# Patient Record
Sex: Female | Born: 1955
Health system: Southern US, Community
[De-identification: ages and names within clinical notes are randomized; demographics above are authoritative.]

## PROBLEM LIST (undated history)

## (undated) HISTORY — PX: CHOLECYSTECTOMY: SHX55

## (undated) HISTORY — PX: BREAST CYST ASPIRATION: SHX578

---

## 2016-03-14 LAB — HM COLONOSCOPY

## 2018-12-13 DIAGNOSIS — H43811 Vitreous degeneration, right eye: Secondary | ICD-10-CM | POA: Diagnosis not present

## 2019-12-10 DIAGNOSIS — R32 Unspecified urinary incontinence: Secondary | ICD-10-CM | POA: Insufficient documentation

## 2019-12-10 DIAGNOSIS — Z23 Encounter for immunization: Secondary | ICD-10-CM | POA: Diagnosis not present

## 2019-12-10 DIAGNOSIS — H4922 Sixth [abducent] nerve palsy, left eye: Secondary | ICD-10-CM | POA: Diagnosis not present

## 2019-12-10 DIAGNOSIS — Z0000 Encounter for general adult medical examination without abnormal findings: Secondary | ICD-10-CM | POA: Diagnosis not present

## 2019-12-10 DIAGNOSIS — Z130 Encounter for screening for diseases of the blood and blood-forming organs and certain disorders involving the immune mechanism: Secondary | ICD-10-CM | POA: Diagnosis not present

## 2019-12-10 DIAGNOSIS — Z124 Encounter for screening for malignant neoplasm of cervix: Secondary | ICD-10-CM | POA: Diagnosis not present

## 2019-12-10 DIAGNOSIS — E559 Vitamin D deficiency, unspecified: Secondary | ICD-10-CM | POA: Diagnosis not present

## 2019-12-10 DIAGNOSIS — Z131 Encounter for screening for diabetes mellitus: Secondary | ICD-10-CM | POA: Diagnosis not present

## 2019-12-10 DIAGNOSIS — R1030 Lower abdominal pain, unspecified: Secondary | ICD-10-CM | POA: Diagnosis not present

## 2019-12-10 DIAGNOSIS — R5383 Other fatigue: Secondary | ICD-10-CM | POA: Diagnosis not present

## 2019-12-10 DIAGNOSIS — Z9049 Acquired absence of other specified parts of digestive tract: Secondary | ICD-10-CM | POA: Diagnosis not present

## 2019-12-10 DIAGNOSIS — Z1231 Encounter for screening mammogram for malignant neoplasm of breast: Secondary | ICD-10-CM | POA: Diagnosis not present

## 2019-12-10 DIAGNOSIS — Z13220 Encounter for screening for lipoid disorders: Secondary | ICD-10-CM | POA: Diagnosis not present

## 2019-12-10 LAB — HM PAP SMEAR: HM Pap smear: NEGATIVE

## 2019-12-10 LAB — RESULTS CONSOLE HPV: CHL HPV: NEGATIVE

## 2019-12-10 LAB — HM MAMMOGRAPHY

## 2019-12-24 ENCOUNTER — Ambulatory Visit: Payer: Self-pay | Admitting: Family Medicine

## 2020-01-08 ENCOUNTER — Ambulatory Visit: Payer: Self-pay | Admitting: Family Medicine

## 2020-01-30 DIAGNOSIS — R278 Other lack of coordination: Secondary | ICD-10-CM | POA: Diagnosis not present

## 2020-01-30 DIAGNOSIS — N816 Rectocele: Secondary | ICD-10-CM | POA: Diagnosis not present

## 2020-01-30 DIAGNOSIS — N6001 Solitary cyst of right breast: Secondary | ICD-10-CM | POA: Diagnosis not present

## 2020-01-30 DIAGNOSIS — N6011 Diffuse cystic mastopathy of right breast: Secondary | ICD-10-CM | POA: Diagnosis not present

## 2020-01-30 DIAGNOSIS — N854 Malposition of uterus: Secondary | ICD-10-CM | POA: Diagnosis not present

## 2020-01-30 DIAGNOSIS — R350 Frequency of micturition: Secondary | ICD-10-CM | POA: Diagnosis not present

## 2020-01-30 DIAGNOSIS — R102 Pelvic and perineal pain: Secondary | ICD-10-CM | POA: Diagnosis not present

## 2020-01-30 DIAGNOSIS — R32 Unspecified urinary incontinence: Secondary | ICD-10-CM | POA: Diagnosis not present

## 2020-02-08 DIAGNOSIS — N858 Other specified noninflammatory disorders of uterus: Secondary | ICD-10-CM | POA: Diagnosis not present

## 2020-02-08 DIAGNOSIS — N3941 Urge incontinence: Secondary | ICD-10-CM | POA: Diagnosis not present

## 2020-02-08 DIAGNOSIS — R9389 Abnormal findings on diagnostic imaging of other specified body structures: Secondary | ICD-10-CM | POA: Diagnosis not present

## 2020-04-30 DIAGNOSIS — R3129 Other microscopic hematuria: Secondary | ICD-10-CM | POA: Diagnosis not present

## 2020-04-30 DIAGNOSIS — R3915 Urgency of urination: Secondary | ICD-10-CM | POA: Diagnosis not present

## 2020-04-30 DIAGNOSIS — R9389 Abnormal findings on diagnostic imaging of other specified body structures: Secondary | ICD-10-CM | POA: Diagnosis not present

## 2020-04-30 DIAGNOSIS — N816 Rectocele: Secondary | ICD-10-CM | POA: Diagnosis not present

## 2020-05-23 ENCOUNTER — Other Ambulatory Visit: Payer: Self-pay | Admitting: Optometry

## 2020-05-23 DIAGNOSIS — H05241 Constant exophthalmos, right eye: Secondary | ICD-10-CM | POA: Diagnosis not present

## 2020-05-23 DIAGNOSIS — H16211 Exposure keratoconjunctivitis, right eye: Secondary | ICD-10-CM | POA: Diagnosis not present

## 2020-05-23 DIAGNOSIS — H0520 Unspecified exophthalmos: Secondary | ICD-10-CM

## 2020-06-04 ENCOUNTER — Ambulatory Visit: Payer: BC Managed Care – PPO

## 2020-06-23 ENCOUNTER — Other Ambulatory Visit: Payer: Self-pay

## 2020-06-23 ENCOUNTER — Ambulatory Visit
Admission: RE | Admit: 2020-06-23 | Discharge: 2020-06-23 | Disposition: A | Discharge status: Home / Self Care | Payer: BC Managed Care – PPO | Source: Ambulatory Visit | Attending: Optometry | Admitting: Optometry

## 2020-06-23 DIAGNOSIS — H5711 Ocular pain, right eye: Secondary | ICD-10-CM | POA: Diagnosis not present

## 2020-06-23 DIAGNOSIS — H0520 Unspecified exophthalmos: Secondary | ICD-10-CM | POA: Diagnosis not present

## 2020-08-21 ENCOUNTER — Telehealth: Payer: Self-pay

## 2020-08-21 NOTE — Telephone Encounter (Signed)
Allison Lowe called in wanted to know if Dr. Einar Pheasant would take her on as a patient her husband [name] 05/03/54.

## 2020-08-21 NOTE — Telephone Encounter (Signed)
That would be fine. Can schedule for September/October

## 2020-09-29 ENCOUNTER — Other Ambulatory Visit: Payer: Self-pay

## 2020-09-29 ENCOUNTER — Ambulatory Visit (INDEPENDENT_AMBULATORY_CARE_PROVIDER_SITE_OTHER): Payer: BC Managed Care – PPO | Admitting: Family Medicine

## 2020-09-29 ENCOUNTER — Encounter: Payer: Self-pay | Admitting: Family Medicine

## 2020-09-29 VITALS — BP 130/82 | HR 75 | Temp 97.1°F | Ht 60.0 in | Wt 153.2 lb

## 2020-09-29 DIAGNOSIS — Z23 Encounter for immunization: Secondary | ICD-10-CM | POA: Diagnosis not present

## 2020-09-29 DIAGNOSIS — N6001 Solitary cyst of right breast: Secondary | ICD-10-CM

## 2020-09-29 DIAGNOSIS — E663 Overweight: Secondary | ICD-10-CM

## 2020-09-29 DIAGNOSIS — D219 Benign neoplasm of connective and other soft tissue, unspecified: Secondary | ICD-10-CM | POA: Insufficient documentation

## 2020-09-29 NOTE — Progress Notes (Signed)
Subjective:     Allison Lowe is a 65 y.o. female presenting for Establish Care     HPI  #weight loss - would like to lose 30 lbs  - not currently doing anything to lose weight - is going on a 3 week road trip to [place] - is trying to reduce red meat and cheese  #Breast cyst - due for f/u imaging for complex cyst   Review of Systems   Social History   Tobacco Use  Smoking Status Former   Packs/day: 0.10   Years: 15.00   Pack years: 1.50   Types: Cigarettes   Quit date: 01/19/2000   Years since quitting: 20.7   Passive exposure: Never  Smokeless Tobacco Never        Objective:    BP Readings from Last 3 Encounters:  09/29/20 130/82   Wt Readings from Last 3 Encounters:  09/29/20 153 lb 4 oz (69.5 kg)    BP 130/82   Pulse 75   Temp (!) 97.1 F (36.2 C) (Temporal)   Ht 5' (1.524 m)   Wt 153 lb 4 oz (69.5 kg)   SpO2 96%   BMI 29.93 kg/m    Physical Exam Constitutional:      General: She is not in acute distress.    Appearance: She is well-developed. She is not diaphoretic.  HENT:     Right Ear: External ear normal.     Left Ear: External ear normal.  Eyes:     Conjunctiva/sclera: Conjunctivae normal.  Cardiovascular:     Rate and Rhythm: Normal rate and regular rhythm.     Heart sounds: No murmur heard. Pulmonary:     Effort: Pulmonary effort is normal. No respiratory distress.     Breath sounds: Normal breath sounds. No wheezing.  Musculoskeletal:     Cervical back: Neck supple.  Skin:    General: Skin is warm and dry.     Capillary Refill: Capillary refill takes less than 2 seconds.  Neurological:     Mental Status: She is alert. Mental status is at baseline.  Psychiatric:        Mood and Affect: Mood normal.        Behavior: Behavior normal.          Assessment & Plan:   Problem List Items Addressed This Visit       Other   Cyst of right breast    Reviewed prior ultrasound 01/2020 with complex and simple cyst recommending  follow-up. Imaging ordered and number for [facility] provided      Relevant Orders   US BREAST LTD UNI RIGHT INC AXILLA   MM 3D SCREEN BREAST BILATERAL   Overweight (BMI 25.0-29.9)    Discussed she may not be eating enough. Advised that she see nutritionist to discuss diet and changes. Referral placed. Encouraged regular exercise      Relevant Orders   Ambulatory referral to Nutrition and Diabetic Education   Other Visit Diagnoses     Need for influenza vaccination    -  Primary   Relevant Orders   Flu Vaccine QUAD 89moIM (Fluarix, Fluzone & Alfiuria Quad PF) (Completed)        Return in about 6 months (around 03/29/2021) for annual.  JLesleigh Noe MD  This visit occurred during the SARS-CoV-2 public health emergency.  Safety protocols were in place, including screening questions prior to the visit, additional usage of staff PPE, and extensive cleaning of exam room while observing  appropriate contact time as indicated for disinfecting solutions.

## 2020-09-29 NOTE — Assessment & Plan Note (Signed)
Discussed she may not be eating enough. Advised that she see nutritionist to discuss diet and changes. Referral placed. Encouraged regular exercise

## 2020-09-29 NOTE — Patient Instructions (Addendum)
Check with insurance that you can do your annual early   Would recommend the following for bone health:   1) 800 units of Vitamin D daily (or could do 5000 twice weekly) 2) Get 1200 mg of elemental calcium --- this is best from your diet. Try to track how much calcium you get on a typical day. You could find ways to add more (dairy products, leafy greens). Take a supplement for whatever you don't typically get so you reach 1200 mg of calcium.  3) Physical activity (ideally weight bearing) - like walking briskly 30 minutes 5 days a week.    Here is what I would recommend:  1) Increase the amount of water you drink a day > specifically drink a glass of water 8 oz or more before every meal 2) Could start a fiber supplement (like metamucil) > You could take this up to 3 times a day, but I would start with 1 time a day until you get used to it. It may cause some stomach upset 3) Make sure you sit down to eat and eat slowly (cut meat one piece at a time) > specifically train your body to eat only at the table (avoiding snacking in front of the TV) 4) Fill up on healthy items first > consider eating a salad with low calorie salad dressing before every meal  5) Make 1/2 of your plate vegetables 6) Keep healthy snacks available for those times when you are bored 7) Consider using a calorie counting app like MyFitnessPal > counting calories helps you make wise choices around snacking. Or if you can afford it you could sign up for Weight Watchers -- and learn about healthy options through a point system   You can call for a mammogram at these locations:  [facility] at [facility].  [place]

## 2020-09-29 NOTE — Assessment & Plan Note (Signed)
Reviewed prior ultrasound 01/2020 with complex and simple cyst recommending follow-up. Imaging ordered and number for [facility] provided

## 2020-10-08 ENCOUNTER — Other Ambulatory Visit: Payer: Self-pay | Admitting: *Deleted

## 2020-10-08 ENCOUNTER — Inpatient Hospital Stay
Admission: RE | Admit: 2020-10-08 | Discharge: 2020-10-08 | Disposition: A | Discharge status: Home / Self Care | Payer: Self-pay | Source: Ambulatory Visit | Attending: *Deleted | Admitting: *Deleted

## 2020-10-08 ENCOUNTER — Other Ambulatory Visit: Payer: Self-pay | Admitting: Family Medicine

## 2020-10-08 DIAGNOSIS — Z1231 Encounter for screening mammogram for malignant neoplasm of breast: Secondary | ICD-10-CM

## 2020-10-08 DIAGNOSIS — N6310 Unspecified lump in the right breast, unspecified quadrant: Secondary | ICD-10-CM

## 2020-11-04 ENCOUNTER — Ambulatory Visit (INDEPENDENT_AMBULATORY_CARE_PROVIDER_SITE_OTHER): Payer: BC Managed Care – PPO | Admitting: Family Medicine

## 2020-11-04 ENCOUNTER — Other Ambulatory Visit: Payer: Self-pay

## 2020-11-04 ENCOUNTER — Encounter: Payer: Self-pay | Admitting: Family Medicine

## 2020-11-04 VITALS — BP 142/82 | HR 66 | Temp 97.0°F | Ht 60.0 in | Wt 153.2 lb

## 2020-11-04 DIAGNOSIS — Z114 Encounter for screening for human immunodeficiency virus [HIV]: Secondary | ICD-10-CM

## 2020-11-04 DIAGNOSIS — R030 Elevated blood-pressure reading, without diagnosis of hypertension: Secondary | ICD-10-CM | POA: Insufficient documentation

## 2020-11-04 DIAGNOSIS — E663 Overweight: Secondary | ICD-10-CM | POA: Diagnosis not present

## 2020-11-04 DIAGNOSIS — Z0000 Encounter for general adult medical examination without abnormal findings: Secondary | ICD-10-CM | POA: Diagnosis not present

## 2020-11-04 DIAGNOSIS — Z23 Encounter for immunization: Secondary | ICD-10-CM

## 2020-11-04 NOTE — Progress Notes (Signed)
Annual Exam   Chief Complaint:  Chief Complaint  Patient presents with   Annual Exam    No concerns     History of Present Illness:  Ms. Allison Lowe is a 65 y.o. No obstetric history on file. who LMP was No LMP recorded. Patient is postmenopausal., presents today for her annual examination.     Nutrition She does get adequate calcium and Vitamin D in her diet. Diet: salad for lunch, healthy Exercise: recent travel so not much recently  Safety The patient wears seatbelts: yes.     The patient feels safe at home and in their relationships: yes.   Menstrual:  No issues  GYN She is single partner, contraception - post menopausal status.    Cervical Cancer Screening (21-65):   Last Pap:   November 2021 Results were: no abnormalities /neg HPV DNA   Breast Cancer Screening (Age 80-74):  There is no FH of breast cancer. There is no FH of ovarian cancer. BRCA screening Not Indicated.  Last Mammogram: scheduled for 11/13/20 The patient does want a mammogram this year.    Colon Cancer Screening:  Age 63-75 yo - benefits outweigh the risk. Adults 53-85 yo who have never been screened benefit.  Benefits: 134000 people in 2016 will be diagnosed and 49,000 will die - early detection helps [place]: Complications 2/2 to colonoscopy High Risk (Colonoscopy): genetic disorder (Lynch syndrome or familial adenomatous polyposis), personal hx of IBD, previous adenomatous polyp, or previous colorectal cancer, FamHx start 10 years before the age at diagnosis, increased in males and black race  Options:  FIT - looks for hemoglobin (blood in the stool) - specific and fairly sensitive - must be done annually Cologuard - looks for DNA and blood - more sensitive - therefore can have more false positives, every 3 years Colonoscopy - every 10 years if normal - sedation, bowl prep, must have someone drive you  Shared decision making and the patient had decided to do colonoscopy - 2018.   Social  History   Tobacco Use  Smoking Status Former   Packs/day: 0.10   Years: 15.00   Pack years: 1.50   Types: Cigarettes   Quit date: 01/19/2000   Years since quitting: 20.8   Passive exposure: Never  Smokeless Tobacco Never    Lung Cancer Screening (Ages 34-80): yes 20 year pack history? No Current Tobacco user? No Quit less than 15 years ago? No Interested in low dose CT for lung cancer screening? not applicable  Weight Wt Readings from Last 3 Encounters:  11/04/20 153 lb 4 oz (69.5 kg)  09/29/20 153 lb 4 oz (69.5 kg)   Patient has high BMI  BMI Readings from Last 1 Encounters:  11/04/20 29.93 kg/m     Chronic disease screening Blood pressure monitoring:  BP Readings from Last 3 Encounters:  11/04/20 (!) 142/82  09/29/20 130/82    Lipid Monitoring: Indication for screening: age >63, obesity, diabetes, family hx, CV risk factors.  Lipid screening: Yes  No results found for: CHOL, HDL, LDLCALC, LDLDIRECT, TRIG, CHOLHDL   Diabetes Screening: age >50, overweight, family hx, PCOS, hx of gestational diabetes, at risk ethnicity Diabetes Screening screening: Yes  No results found for: HGBA1C   History reviewed. No pertinent past medical history.  Past Surgical History:  Procedure Laterality Date   CHOLECYSTECTOMY      Prior to Admission medications   Not on File    No Known Allergies  Gynecologic History: No LMP recorded. Patient is  postmenopausal.  Obstetric History: No obstetric history on file.  Social History   Socioeconomic History   Marital status: Married    Spouse name: Yvone Neu   Number of children: 2   Years of education: college   Highest education level: Not on file  Occupational History   Not on file  Tobacco Use   Smoking status: Former    Packs/day: 0.10    Years: 15.00    Pack years: 1.50    Types: Cigarettes    Quit date: 01/19/2000    Years since quitting: 20.8    Passive exposure: Never   Smokeless tobacco: Never  Vaping Use    Vaping Use: Never used  Substance and Sexual Activity   Alcohol use: Yes    Comment: 1-2 glasses a few times a week   Drug use: Never   Sexual activity: Yes    Birth control/protection: Post-menopausal  Other Topics Concern   Not on file  Social History Narrative   09/29/20   From: [place] and [place] (6 years in [place])    Living: with husband, Yvone Neu and Scientist, forensic   Work: Chartered certified accountant for [employer]      Family: 2 children - Database administrator and Social research officer, government - no grandkids      Enjoys: quilting, hiking, kayaking and paddle boarding      Exercise: rowing machine and stretching at home - 4-5 times a week   Diet: bagel and cream cheese in the morning, skips lunch (half a sandwich), salad or meat+side dish      Safety   Seat belts: Yes    Guns: No   Safe in relationships: Yes       Social Determinants of Radio broadcast assistant Strain: Not on file  Food Insecurity: Not on file  Transportation Needs: Not on file  Physical Activity: Not on file  Stress: Not on file  Social Connections: Not on file  Intimate Partner Violence: Not on file    Family History  Problem Relation Age of Onset   Hypertension Mother    Early death Father        accidental death   Colon polyps Sister    Prostate cancer Brother     Review of Systems  Constitutional:  Negative for chills and fever.  HENT:  Negative for congestion and sore throat.   Eyes:  Negative for blurred vision and double vision.  Respiratory:  Negative for shortness of breath.   Cardiovascular:  Negative for chest pain.  Gastrointestinal:  Negative for heartburn, nausea and vomiting.  Genitourinary: Negative.   Musculoskeletal: Negative.  Negative for myalgias.  Skin:  Negative for rash.  Neurological:  Negative for dizziness and headaches.  Endo/Heme/Allergies:  Does not bruise/bleed easily.  Psychiatric/Behavioral:  Negative for depression. The patient is not nervous/anxious.     Physical Exam BP (!) 142/82   Pulse 66   Temp  (!) 97 F (36.1 C) (Temporal)   Ht 5' (1.524 m)   Wt 153 lb 4 oz (69.5 kg)   SpO2 98%   BMI 29.93 kg/m    BP Readings from Last 3 Encounters:  11/04/20 (!) 142/82  09/29/20 130/82      Physical Exam Constitutional:      General: She is not in acute distress.    Appearance: She is well-developed. She is not diaphoretic.  HENT:     Head: Normocephalic and atraumatic.     Right Ear: External ear normal.     Left  Ear: External ear normal.     Nose: Nose normal.  Eyes:     General: No scleral icterus.    Extraocular Movements: Extraocular movements intact.     Conjunctiva/sclera: Conjunctivae normal.  Cardiovascular:     Rate and Rhythm: Normal rate and regular rhythm.     Heart sounds: No murmur heard. Pulmonary:     Effort: Pulmonary effort is normal. No respiratory distress.     Breath sounds: Normal breath sounds. No wheezing.  Abdominal:     General: Bowel sounds are normal. There is no distension.     Palpations: Abdomen is soft. There is no mass.     Tenderness: There is no abdominal tenderness. There is no guarding or rebound.  Musculoskeletal:        General: Normal range of motion.     Cervical back: Neck supple.  Lymphadenopathy:     Cervical: No cervical adenopathy.  Skin:    General: Skin is warm and dry.     Capillary Refill: Capillary refill takes less than 2 seconds.  Neurological:     Mental Status: She is alert and oriented to person, place, and time.     Deep Tendon Reflexes: Reflexes normal.  Psychiatric:        Mood and Affect: Mood normal.        Behavior: Behavior normal.    Results:  PHQ-9:  [place] Office Visit from 09/29/2020 in [facility] at [place]  PHQ-9 Total Score 5         Assessment: 65 y.o. No obstetric history on file. female here for routine annual physical examination.  Plan: Problem List Items Addressed This Visit       Other   Overweight (BMI 25.0-29.9)   Relevant Orders   Comprehensive  metabolic panel   Lipid panel   Elevated blood pressure reading    Home monitoring. Return if elevated at home      Other Visit Diagnoses     Annual physical exam    -  Primary   Relevant Orders   Comprehensive metabolic panel   Lipid panel   Encounter for screening for HIV       Relevant Orders   HIV Antibody (routine testing w rflx)   Need for shingles vaccine       Relevant Orders   Varicella-zoster vaccine IM (Completed)       Screening: -- Blood pressure screen elevated: continued to monitor. -- cholesterol screening: will obtain -- Weight screening: overweight: continue to monitor -- Diabetes Screening: will obtain -- Nutrition: Encouraged healthy diet  The ASCVD Risk score (Arnett DK, et al., 2019) failed to calculate for the following reasons:   Cannot find a previous HDL lab  -- Statin therapy for Age 42-75 with CVD risk >7.5%  Psych -- Depression screening (PHQ-9):  [place] Visit from 09/29/2020 in [place] at [facility]  PHQ-9 Total Score 5        Safety -- tobacco screening: not using -- alcohol screening:  low-risk usage. -- no evidence of domestic violence or intimate partner violence.   Cancer Screening -- pap smear not collected per ASCCP guidelines -- family history of breast cancer screening: done. not at high risk. -- Mammogram -  scheduled -- Colon cancer (age 52+)--  will get records  Immunizations Immunization History  Administered Date(s) Administered   Influenza,inj,Quad PF,6+ Mos 12/10/2019, 09/29/2020   PFIZER(Purple Top)SARS-COV-2 Vaccination 04/27/2019, 05/25/2019, 12/19/2019   Tdap 10/19/2011  Zoster Recombinat (Shingrix) 11/04/2020   Zoster, Live 03/29/2013    -- flu vaccine up to date -- TDAP q10 years up to date -- Shingles (age >67) up to date -- Covid-19 Vaccine up to date   Encouraged healthy diet and exercise. Encouraged regular vision and dental care.    Lesleigh Noe, MD

## 2020-11-04 NOTE — Patient Instructions (Signed)
Your blood pressure high.   High blood pressure increases your risk for heart attack and stroke.    Please check your blood pressure 2-4 times a week.   To check your blood pressure 1) Sit in a quiet and relaxed place for 5 minutes 2) Make sure your feet are flat on the ground 3) Consider checking first thing in the morning   Normal blood pressure is less than 140/90 Ideally you blood pressure should be around 120/80  Other ways you can reduce your blood pressure:  1) Regular exercise -- Try to get 150 minutes (30 minutes, 5 days a week) of moderate to vigorous aerobic excercise -- Examples: brisk walking (2.5 miles per hour), water aerobics, dancing, gardening, tennis, biking slower than 10 miles per hour 2) DASH Diet - low fat meats, more fresh fruits and vegetables, whole grains, low salt 3) Quit smoking if you smoke 4) Loose 5-10% of your body weight

## 2020-11-04 NOTE — Assessment & Plan Note (Addendum)
Home monitoring. Return if elevated at home

## 2020-11-05 LAB — COMPREHENSIVE METABOLIC PANEL
ALT: 37 U/L — ABNORMAL HIGH (ref 0–35)
AST: 25 U/L (ref 0–37)
Albumin: 4.6 g/dL (ref 3.5–5.2)
Alkaline Phosphatase: 132 U/L — ABNORMAL HIGH (ref 39–117)
BUN: 14 mg/dL (ref 6–23)
CO2: 27 mEq/L (ref 19–32)
Calcium: 9.5 mg/dL (ref 8.4–10.5)
Chloride: 104 mEq/L (ref 96–112)
Creatinine, Ser: 0.66 mg/dL (ref 0.40–1.20)
GFR: 92.67 mL/min (ref >60.00)
Glucose, Bld: 91 mg/dL (ref 70–99)
Potassium: 3.9 mEq/L (ref 3.5–5.1)
Sodium: 139 mEq/L (ref 135–145)
Total Bilirubin: 0.4 mg/dL (ref 0.2–1.2)
Total Protein: 7.2 g/dL (ref 6.0–8.3)

## 2020-11-05 LAB — HIV ANTIBODY (ROUTINE TESTING W REFLEX): HIV 1&2 Ab, 4th Generation: NONREACTIVE

## 2020-11-05 LAB — LIPID PANEL
Cholesterol: 254 mg/dL — ABNORMAL HIGH (ref 0–200)
HDL: 56 mg/dL (ref >39.00)
LDL Cholesterol: 167 mg/dL — ABNORMAL HIGH (ref 0–99)
NonHDL: 197.85
Total CHOL/HDL Ratio: 5
Triglycerides: 152 mg/dL — ABNORMAL HIGH (ref 0.0–149.0)
VLDL: 30.4 mg/dL (ref 0.0–40.0)

## 2020-11-13 ENCOUNTER — Ambulatory Visit
Admission: RE | Admit: 2020-11-13 | Discharge: 2020-11-13 | Disposition: A | Discharge status: Home / Self Care | Payer: BC Managed Care – PPO | Source: Ambulatory Visit | Attending: Family Medicine | Admitting: Family Medicine

## 2020-11-13 ENCOUNTER — Other Ambulatory Visit: Payer: Self-pay

## 2020-11-13 DIAGNOSIS — N6001 Solitary cyst of right breast: Secondary | ICD-10-CM

## 2020-11-13 DIAGNOSIS — N6310 Unspecified lump in the right breast, unspecified quadrant: Secondary | ICD-10-CM | POA: Diagnosis not present

## 2020-11-13 DIAGNOSIS — R922 Inconclusive mammogram: Secondary | ICD-10-CM | POA: Diagnosis not present

## 2020-11-14 ENCOUNTER — Encounter: Payer: Self-pay | Admitting: Family Medicine

## 2021-01-06 ENCOUNTER — Ambulatory Visit: Payer: BC Managed Care – PPO

## 2021-11-04 ENCOUNTER — Encounter: Payer: BC Managed Care – PPO | Admitting: Family Medicine

## 2021-11-05 ENCOUNTER — Ambulatory Visit (INDEPENDENT_AMBULATORY_CARE_PROVIDER_SITE_OTHER): Payer: BC Managed Care – PPO | Admitting: Nurse Practitioner

## 2021-11-05 VITALS — BP 126/78 | HR 65 | Temp 96.0°F | Resp 12 | Wt 151.2 lb

## 2021-11-05 DIAGNOSIS — L989 Disorder of the skin and subcutaneous tissue, unspecified: Secondary | ICD-10-CM

## 2021-11-05 DIAGNOSIS — L0291 Cutaneous abscess, unspecified: Secondary | ICD-10-CM | POA: Diagnosis not present

## 2021-11-05 MED ORDER — SULFAMETHOXAZOLE-TRIMETHOPRIM 800-160 MG PO TABS: 1.0000 | ORAL_TABLET | Freq: Two times a day (BID) | ORAL | 0 refills | Status: AC

## 2021-11-05 NOTE — Patient Instructions (Signed)
Nice to see you today The antibiotic should take care of the spot If it gets to bothering you you can use warm compress to help relieve the discomfort.

## 2021-11-05 NOTE — Progress Notes (Signed)
   Acute Office Visit  Subjective:     Patient ID: Allison Lowe, female    DOB: Apr 30, 1955, 66 y.o.   MRN: [id number]  Chief Complaint  Patient presents with   Mass    Noticed it this morning in the shower, in the vaginal/groin area to the right side. No pain.    HPI Patient is in today for Mass/bump  States that she was showering this monrning and noticed a bump on her vagina. States that it is not painful. States that it is red per her husband. States that she also has bumbs on her right hand on the palm and on her back that she would like looked at as well.  States no history of vulvar abscess. States that she did use some neosporin on it today.  No vaginal itching/ discharge. No urinary issues.  States that she is leaving Sunday to kayak and wanted to be evaluated.  Review of Systems  Constitutional:  Negative for chills and fever.  Genitourinary:  Negative for dysuria.       Denies vaginal bleeding or discharge   Skin:        "+" lesions        Objective:    BP 126/78   Pulse 65   Temp (!) 96 F (35.6 C) (Temporal)   Resp 12   Wt 151 lb 4 oz (68.6 kg)   SpO2 98%   BMI 29.54 kg/m    Physical Exam Vitals and nursing note reviewed. Chaperone present: [employer], RMA.  Constitutional:      Appearance: Normal appearance.  Genitourinary:   Skin:    General: Skin is warm.          Comments: Scattered cherry angiomas on the back and neck   Neurological:     Mental Status: She is alert.     No results found for any visits on 11/05/21.      Assessment & Plan:   Problem List Items Addressed This Visit       Musculoskeletal and Integument   Skin lesions    Several areas of concern.  Noted to have a skin tag on the back and several cherry angiomas along the upper portion of her back.  Discussed that these are benign in nature.  Patient may have the beginnings of a verruca vulgaris on the palmar surface of her right hand.  Did discuss watchful  waiting and the possibility of using over-the-counter salicylic acid wart remover needed.        Other   Abscess - Primary    Area of concerned concerning for beginning stages of abscess.  Will elect to treat patient with Bactrim DS 1 tablet twice daily for 7 days.  She can also use warm compresses to the area becomes aggravated.  Follow-up if no improvement      Relevant Medications   sulfamethoxazole-trimethoprim (BACTRIM DS) 800-160 MG tablet    Meds ordered this encounter  Medications   sulfamethoxazole-trimethoprim (BACTRIM DS) 800-160 MG tablet    Sig: Take 1 tablet by mouth 2 (two) times daily for 7 days.    Dispense:  14 tablet    Refill:  0    Order Specific Question:   Supervising Provider    Answer:   Loura Pardon A [[id number]]    Return if symptoms worsen or fail to improve, for As scheduled with tabitha .  Romilda Garret, NP

## 2021-11-06 DIAGNOSIS — L0291 Cutaneous abscess, unspecified: Secondary | ICD-10-CM | POA: Insufficient documentation

## 2021-11-06 DIAGNOSIS — L989 Disorder of the skin and subcutaneous tissue, unspecified: Secondary | ICD-10-CM | POA: Insufficient documentation

## 2021-11-06 NOTE — Assessment & Plan Note (Signed)
Area of concerned concerning for beginning stages of abscess.  Will elect to treat patient with Bactrim DS 1 tablet twice daily for 7 days.  She can also use warm compresses to the area becomes aggravated.  Follow-up if no improvement

## 2021-11-06 NOTE — Assessment & Plan Note (Signed)
Several areas of concern.  Noted to have a skin tag on the back and several cherry angiomas along the upper portion of her back.  Discussed that these are benign in nature.  Patient may have the beginnings of a verruca vulgaris on the palmar surface of her right hand.  Did discuss watchful waiting and the possibility of using over-the-counter salicylic acid wart remover needed.

## 2021-11-12 ENCOUNTER — Encounter: Payer: BC Managed Care – PPO | Admitting: Family

## 2021-11-19 ENCOUNTER — Encounter: Payer: Self-pay | Admitting: Family

## 2021-11-19 ENCOUNTER — Ambulatory Visit (INDEPENDENT_AMBULATORY_CARE_PROVIDER_SITE_OTHER): Payer: BC Managed Care – PPO | Admitting: Family

## 2021-11-19 VITALS — BP 128/74 | HR 64 | Temp 98.4°F | Ht 60.0 in | Wt 149.0 lb

## 2021-11-19 DIAGNOSIS — R7989 Other specified abnormal findings of blood chemistry: Secondary | ICD-10-CM | POA: Diagnosis not present

## 2021-11-19 DIAGNOSIS — E559 Vitamin D deficiency, unspecified: Secondary | ICD-10-CM

## 2021-11-19 DIAGNOSIS — Z780 Asymptomatic menopausal state: Secondary | ICD-10-CM

## 2021-11-19 DIAGNOSIS — E663 Overweight: Secondary | ICD-10-CM

## 2021-11-19 DIAGNOSIS — L989 Disorder of the skin and subcutaneous tissue, unspecified: Secondary | ICD-10-CM

## 2021-11-19 DIAGNOSIS — Z1231 Encounter for screening mammogram for malignant neoplasm of breast: Secondary | ICD-10-CM

## 2021-11-19 DIAGNOSIS — Z720 Tobacco use: Secondary | ICD-10-CM | POA: Insufficient documentation

## 2021-11-19 DIAGNOSIS — E782 Mixed hyperlipidemia: Secondary | ICD-10-CM | POA: Diagnosis not present

## 2021-11-19 DIAGNOSIS — Z23 Encounter for immunization: Secondary | ICD-10-CM

## 2021-11-19 DIAGNOSIS — L0291 Cutaneous abscess, unspecified: Secondary | ICD-10-CM

## 2021-11-19 DIAGNOSIS — Z0001 Encounter for general adult medical examination with abnormal findings: Secondary | ICD-10-CM | POA: Insufficient documentation

## 2021-11-19 LAB — COMPREHENSIVE METABOLIC PANEL
ALT: 40 U/L — ABNORMAL HIGH (ref 0–35)
AST: 31 U/L (ref 0–37)
Albumin: 4.7 g/dL (ref 3.5–5.2)
Alkaline Phosphatase: 114 U/L (ref 39–117)
BUN: 15 mg/dL (ref 6–23)
CO2: 27 mEq/L (ref 19–32)
Calcium: 9.8 mg/dL (ref 8.4–10.5)
Chloride: 103 mEq/L (ref 96–112)
Creatinine, Ser: 0.66 mg/dL (ref 0.40–1.20)
GFR: 91.99 mL/min (ref >60.00)
Glucose, Bld: 92 mg/dL (ref 70–99)
Potassium: 4.2 mEq/L (ref 3.5–5.1)
Sodium: 139 mEq/L (ref 135–145)
Total Bilirubin: 0.5 mg/dL (ref 0.2–1.2)
Total Protein: 7.4 g/dL (ref 6.0–8.3)

## 2021-11-19 LAB — CBC WITH DIFFERENTIAL/PLATELET
Basophils Absolute: 0.1 10*3/uL (ref 0.0–0.1)
Basophils Relative: 1 % (ref 0.0–3.0)
Eosinophils Absolute: 0.1 10*3/uL (ref 0.0–0.7)
Eosinophils Relative: 1.6 % (ref 0.0–5.0)
HCT: 45.1 % (ref 36.0–46.0)
Hemoglobin: 15.3 g/dL — ABNORMAL HIGH (ref 12.0–15.0)
Lymphocytes Relative: 31.3 % (ref 12.0–46.0)
Lymphs Abs: 2.5 10*3/uL (ref 0.7–4.0)
MCHC: 34 g/dL (ref 30.0–36.0)
MCV: 94.6 fl (ref 78.0–100.0)
Monocytes Absolute: 0.4 10*3/uL (ref 0.1–1.0)
Monocytes Relative: 5.5 % (ref 3.0–12.0)
Neutro Abs: 4.7 10*3/uL (ref 1.4–7.7)
Neutrophils Relative %: 60.6 % (ref 43.0–77.0)
Platelets: 329 10*3/uL (ref 150.0–400.0)
RBC: 4.77 Mil/uL (ref 3.87–5.11)
RDW: 13.3 % (ref 11.5–15.5)
WBC: 7.8 10*3/uL (ref 4.0–10.5)

## 2021-11-19 LAB — VITAMIN D 25 HYDROXY (VIT D DEFICIENCY, FRACTURES): VITD: 23.57 ng/mL — ABNORMAL LOW (ref 30.00–100.00)

## 2021-11-19 LAB — LIPID PANEL
Cholesterol: 258 mg/dL — ABNORMAL HIGH (ref 0–200)
HDL: 55.6 mg/dL (ref >39.00)
LDL Cholesterol: 171 mg/dL — ABNORMAL HIGH (ref 0–99)
NonHDL: 202.18
Total CHOL/HDL Ratio: 5
Triglycerides: 155 mg/dL — ABNORMAL HIGH (ref 0.0–149.0)
VLDL: 31 mg/dL (ref 0.0–40.0)

## 2021-11-19 NOTE — Patient Instructions (Addendum)
------------------------------------    Flu vaccination today, and also pneumonia vaccination.   Come back nurse only visit two weeks and get tetanus and shingrix vaccination.  ------------------------------------   I have sent an electronic order over to your preferred location for the following:   '[]'$   2D Mammogram  '[x]'$   3D Mammogram  '[x]'$   Bone Density   Please give this center a call to get scheduled at your convenience.  '[x]'$   [facility]  [place] [address]  [phone]  Make sure to wear two piece  clothing  No lotions powders or deodorants the day of the appointment Make sure to bring picture ID and insurance card.  Bring list of medications you are currently taking including any supplements.    ------------------------------------  Welcome to our clinic, I am happy to have you as my new patient. I am excited to continue on this healthcare journey with you.  Stop by the lab prior to leaving today. I will notify you of your results once received.   Please keep in mind Any my chart messages you send have up to a three business day turnaround for a response.  Phone calls may take up to a one full business day turnaround for a  response.   If you need a medication refill I recommend you request it through the pharmacy as this is easiest for [place] rather than sending a message and or phone call.   Due to recent changes in healthcare laws, you may see results of your imaging and/or laboratory studies on MyChart before I have had a chance to review them.  I understand that in some cases there may be results that are confusing or concerning to you. Please understand that not all results are received at the same time and often I may need to interpret multiple results in order to provide you with the best plan of care or course of treatment. Therefore, I ask that you please give me 2 business days to  thoroughly review all your results before contacting my office for clarification. Should we see a critical lab result, you will be contacted sooner.   It was a pleasure seeing you today! Please do not hesitate to reach out with any questions and or concerns.  Regards,   Eugenia Pancoast FNP-C

## 2021-11-19 NOTE — Assessment & Plan Note (Signed)
improving

## 2021-11-19 NOTE — Progress Notes (Signed)
Established Patient Office Visit  Subjective:  Patient ID: Allison Lowe, female    DOB: [birth year]  Age: 66 y.o. MRN: [id number]  CC:  Chief Complaint  Patient presents with   [place] from [place]     HPI Allison Lowe is here for a transition of care visit as well as for Cpe.   Prior provider was: Dr. Waunita Schooner  Pt is without acute concerns.   Abscess, around groin area, not as hard anymore. Completed bactrim for seven days. It is no longer tender. No discharge seen however she states she really can not see it herself. No fever or chills.   Skin lesions: several lesions/skin tags on back with severala cherry angiomas. Moved here two years ago, did see dermatologist when she lived in her prior location ([place], [place])  Tobacco abuse: quit back in 2022, however trying to quit again, has not had a cigarette in two weeks.   Mammogram: 11/13/20, screening mammogram ordered  Shingrix: 11/05/20, started, however didn't get second vaccination.  Pneumococcal: due, >46 y/o  Dexa scan: > 5y/o  Flu vaccine: will get today.  TDAP vaccination: overdue.  Mammogram 11/13/20:  Pap smear: 12/10/19, no h/o abn age   chronic concerns:  H/o breast cyst aspiration on right side of breast.   History reviewed. No pertinent past medical history.  Past Surgical History:  Procedure Laterality Date   BREAST CYST ASPIRATION Right    CHOLECYSTECTOMY      Family History  Problem Relation Age of Onset   Hypertension Mother        in assisted living   Heart disease Mother    Vascular Disease Mother        vascular dementia   Early death Father        accidental death   Colon polyps Sister    Prostate cancer Brother     Social History   Socioeconomic History   Marital status: Married    Spouse name: Yvone Neu   Number of children: 2   Years of education: college   Highest education level: Not on file  Occupational History    Employer: [employer]  Tobacco Use   Smoking  status: Former    Packs/day: 0.10    Years: 15.00    Total pack years: 1.50    Types: Cigarettes    Quit date: 01/19/2000    Years since quitting: 21.8    Passive exposure: Never   Smokeless tobacco: Never  Vaping Use   Vaping Use: Never used  Substance and Sexual Activity   Alcohol use: Yes    Comment: 1-2 glasses a few times a week   Drug use: Never   Sexual activity: Yes    Partners: Male    Birth control/protection: Post-menopausal  Other Topics Concern   Not on file  Social History Narrative   09/29/20   From: [place] and [place] (17 years in [place])    Living: with husband, Yvone Neu and Scientist, forensic   Work: Chartered certified accountant for [employer]      Family: 2 children - [place] and Social research officer, government -daughter and son - no grandkids      Enjoys: quilting, hiking, kayaking and paddle boarding      Exercise: rowing machine and stretching at home - 4-5 times a week   Diet: bagel and cream cheese in the morning, skips lunch (half a sandwich), salad or meat+side dish      Safety   Seat belts:  Yes    Guns: No   Safe in relationships: Yes       Social Determinants of Radio broadcast assistant Strain: Not on file  Food Insecurity: Not on file  Transportation Needs: Not on file  Physical Activity: Not on file  Stress: Not on file  Social Connections: Not on file  Intimate Partner Violence: Not on file    No outpatient medications prior to visit.   No facility-administered medications prior to visit.    No Known Allergies  Review of Systems  Constitutional:  Negative for chills and fatigue.  Respiratory:  Negative for cough and shortness of breath.   Cardiovascular:  Negative for chest pain and leg swelling.  Gastrointestinal:  Negative for diarrhea and nausea.  Genitourinary:  Negative for difficulty urinating, menstrual problem and vaginal bleeding.  Neurological:  Negative for dizziness and headaches.  Psychiatric/Behavioral:  Negative for agitation and sleep disturbance.   All other  systems reviewed and are negative.      Objective:    Physical Exam Vitals reviewed.  Constitutional:      General: She is not in acute distress.    Appearance: Normal appearance. She is not ill-appearing or toxic-appearing.  HENT:     Right Ear: Tympanic membrane normal.     Left Ear: Tympanic membrane normal.     Mouth/Throat:     Mouth: Mucous membranes are moist.     Pharynx: No pharyngeal swelling.     Tonsils: No tonsillar exudate.  Eyes:     Extraocular Movements: Extraocular movements intact.     Conjunctiva/sclera: Conjunctivae normal.     Pupils: Pupils are equal, round, and reactive to light.  Neck:     Thyroid: No thyroid mass.  Cardiovascular:     Rate and Rhythm: Normal rate and regular rhythm.  Pulmonary:     Effort: Pulmonary effort is normal.     Breath sounds: Normal breath sounds.  Abdominal:     General: Abdomen is flat. Bowel sounds are normal.     Palpations: Abdomen is soft.  Musculoskeletal:        General: Normal range of motion.  Lymphadenopathy:     Cervical:     Right cervical: No superficial cervical adenopathy.    Left cervical: No superficial cervical adenopathy.  Skin:    General: Skin is warm.     Capillary Refill: Capillary refill takes less than 2 seconds.     Findings: Lesion (healing skin abscess on right inner groin area, no warmth to site, flat with mild redness. no induration. no discharge.) present.  Neurological:     General: No focal deficit present.     Mental Status: She is alert and oriented to person, place, and time.  Psychiatric:        Mood and Affect: Mood normal.        Behavior: Behavior normal.        Thought Content: Thought content normal.        Judgment: Judgment normal.     BP 128/74   Pulse 64   Temp 98.4 F (36.9 C) (Oral)   Ht 5' (1.524 m)   Wt 149 lb (67.6 kg)   SpO2 98%   BMI 29.10 kg/m  Wt Readings from Last 3 Encounters:  11/19/21 149 lb (67.6 kg)  11/05/21 151 lb 4 oz (68.6 kg)   11/04/20 153 lb 4 oz (69.5 kg)     Health Maintenance Due  Topic Date Due  Hepatitis C Screening  Never done   Zoster Vaccines- Shingrix (2 of 2) 12/30/2020   DEXA SCAN  Never done   TETANUS/TDAP  10/18/2021    There are no preventive care reminders to display for this patient.  No results found for: "TSH" Lab Results  Component Value Date   WBC 7.8 11/19/2021   HGB 15.3 (H) 11/19/2021   HCT 45.1 11/19/2021   MCV 94.6 11/19/2021   PLT 329.0 11/19/2021   Lab Results  Component Value Date   NA 139 11/19/2021   K 4.2 11/19/2021   CO2 27 11/19/2021   GLUCOSE 92 11/19/2021   BUN 15 11/19/2021   CREATININE 0.66 11/19/2021   BILITOT 0.5 11/19/2021   ALKPHOS 114 11/19/2021   AST 31 11/19/2021   ALT 40 (H) 11/19/2021   PROT 7.4 11/19/2021   ALBUMIN 4.7 11/19/2021   CALCIUM 9.8 11/19/2021   GFR 91.99 11/19/2021   Lab Results  Component Value Date   CHOL 258 (H) 11/19/2021   Lab Results  Component Value Date   HDL 55.60 11/19/2021   Lab Results  Component Value Date   LDLCALC 171 (H) 11/19/2021   Lab Results  Component Value Date   TRIG 155.0 (H) 11/19/2021   Lab Results  Component Value Date   CHOLHDL 5 11/19/2021   No results found for: "HGBA1C"    Assessment & Plan:   Problem List Items Addressed This Visit       Musculoskeletal and Integument   Skin lesions    Referral placed to dermatology for recommendation for annual skin screening.       Relevant Orders   Ambulatory referral to Dermatology     Other   Overweight (BMI 25.0-29.9)    Work on diet and exercise as tolerated       Abscess    improving      Vitamin D deficiency - Primary    Ordered vitamin d pending results.        Relevant Orders   VITAMIN D 25 Hydroxy (Vit-D Deficiency, Fractures) (Completed)   Currently attempting to quit smoking    Smoking cessation instruction/counseling given:  commended patient for quitting and reviewed strategies for preventing  relapses       Encounter for general adult medical examination with abnormal findings    Patient Counseling(The following topics were reviewed):  Preventative care handout given to pt  Health maintenance and immunizations reviewed. Please refer to Health maintenance section. Pt advised on safe sex, wearing seatbelts in car, and proper nutrition labwork ordered today for annual Dental health: Discussed importance of regular tooth brushing, flossing, and dental visits. Ordered mammogram and bone density  Flu vaccine and pneumonococcal 23 today.  Pt to rtc with nurse only visit for td as well as shingrix (2nd)      Relevant Orders   Comprehensive metabolic panel (Completed)   Lipid panel (Completed)   CBC with Differential/Platelet (Completed)   VITAMIN D 25 Hydroxy (Vit-D Deficiency, Fractures) (Completed)   Mixed hyperlipidemia    Ordered lipid panel, pending results. Work on low cholesterol diet and exercise as tolerated       Relevant Orders   Lipid panel (Completed)   Other Visit Diagnoses     Encounter for screening mammogram for malignant neoplasm of breast       Relevant Orders   MM 3D SCREEN BREAST BILATERAL   Postmenopausal       Relevant Orders   DG Bone Density   Elevated  liver function tests       Relevant Orders   Comprehensive metabolic panel (Completed)   Need for 23-polyvalent pneumococcal polysaccharide vaccine       Relevant Orders   Pneumococcal polysaccharide vaccine 23-valent greater than or equal to 2yo subcutaneous/IM (Completed)   Need for influenza vaccination       Relevant Orders   Flu Vaccine QUAD High Dose(Fluad) (Completed)       No orders of the defined types were placed in this encounter.   Follow-up: Return in about 6 months (around 05/20/2022) for f/u CPE.    Eugenia Pancoast, FNP

## 2021-11-19 NOTE — Assessment & Plan Note (Signed)
Ordered vitamin d pending results.   

## 2021-11-19 NOTE — Assessment & Plan Note (Signed)
Work on diet and exercise as tolerated  ?

## 2021-11-19 NOTE — Assessment & Plan Note (Signed)
Patient Counseling(The following topics were reviewed):  Preventative care handout given to pt  Health maintenance and immunizations reviewed. Please refer to Health maintenance section. Pt advised on safe sex, wearing seatbelts in car, and proper nutrition labwork ordered today for annual Dental health: Discussed importance of regular tooth brushing, flossing, and dental visits. Ordered mammogram and bone density  Flu vaccine and pneumonococcal 23 today.  Pt to rtc with nurse only visit for td as well as shingrix (2nd)

## 2021-11-19 NOTE — Assessment & Plan Note (Signed)
Smoking cessation instruction/counseling given:  commended patient for quitting and reviewed strategies for preventing relapses 

## 2021-11-19 NOTE — Assessment & Plan Note (Signed)
Ordered lipid panel, pending results. Work on low cholesterol diet and exercise as tolerated ? ?

## 2021-11-19 NOTE — Assessment & Plan Note (Signed)
Referral placed to dermatology for recommendation for annual skin screening.

## 2021-11-25 ENCOUNTER — Encounter: Payer: Self-pay | Admitting: Family

## 2021-12-03 ENCOUNTER — Ambulatory Visit (INDEPENDENT_AMBULATORY_CARE_PROVIDER_SITE_OTHER): Payer: BC Managed Care – PPO

## 2021-12-03 DIAGNOSIS — Z23 Encounter for immunization: Secondary | ICD-10-CM | POA: Diagnosis not present

## 2022-02-03 ENCOUNTER — Other Ambulatory Visit: Payer: BC Managed Care – PPO

## 2022-03-16 ENCOUNTER — Ambulatory Visit
Admission: RE | Admit: 2022-03-16 | Discharge: 2022-03-16 | Disposition: A | Discharge status: Home / Self Care | Payer: BC Managed Care – PPO | Source: Ambulatory Visit | Attending: Family | Admitting: Family

## 2022-03-16 DIAGNOSIS — Z1231 Encounter for screening mammogram for malignant neoplasm of breast: Secondary | ICD-10-CM

## 2022-03-16 DIAGNOSIS — Z780 Asymptomatic menopausal state: Secondary | ICD-10-CM

## 2022-03-16 DIAGNOSIS — M85852 Other specified disorders of bone density and structure, left thigh: Secondary | ICD-10-CM | POA: Diagnosis not present

## 2022-05-12 ENCOUNTER — Telehealth: Payer: Self-pay | Admitting: Family

## 2022-05-12 NOTE — Telephone Encounter (Signed)
Pt's husband, Iantha Fallen, called stating him & the pt are going on a cruise on 5/10 & the cruise is requiring a copy of their immunization records. Iantha Fallen is asking can this ppw is prepared for the pt to pick up? Iantha Fallen requested a call back once ppw is ready. Call back # [phone]

## 2022-05-12 NOTE — Telephone Encounter (Signed)
Printed, and husband called

## 2022-09-13 ENCOUNTER — Encounter: Payer: Self-pay | Admitting: Internal Medicine

## 2022-09-13 ENCOUNTER — Ambulatory Visit (INDEPENDENT_AMBULATORY_CARE_PROVIDER_SITE_OTHER): Payer: BC Managed Care – PPO | Admitting: Internal Medicine

## 2022-09-13 VITALS — BP 110/80 | HR 80 | Temp 97.7°F | Ht 60.0 in | Wt 151.0 lb

## 2022-09-13 DIAGNOSIS — R051 Acute cough: Secondary | ICD-10-CM

## 2022-09-13 DIAGNOSIS — U071 COVID-19: Secondary | ICD-10-CM | POA: Insufficient documentation

## 2022-09-13 LAB — POC COVID19 BINAXNOW: SARS Coronavirus 2 Ag: POSITIVE — AB

## 2022-09-13 NOTE — Assessment & Plan Note (Signed)
Fairly mild symptoms Did worsen after going out to the funeral No SOB---discussed ER if this happens Supportive care Discussed anti-virals---would not be recommended unless her symptoms worsened No in person work till next week----mask for a week after

## 2022-09-13 NOTE — Progress Notes (Signed)
Subjective:    Patient ID: Allison Lowe, female    DOB: 1955/06/21, 67 y.o.   MRN: [id number]  HPI Here due to respiratory illness  Got sick 3 days ago Really bad in the first day--chills, aches, didn't sleep that first night (tried nyquil) Even had teeth chattering shakes once Didn't check temp--no sweats Some better yesterday--able to go to funeral (stayed in back with mask) Now woke with sore throat and pain with swallowing Some cough still  Aching--esp back persists No SOB--but can feel it there at times Has headache No ear pain  Using ricola for the throat Some tylenol  Current Outpatient Medications on File Prior to Visit  Medication Sig Dispense Refill   Calcium Carb-Cholecalciferol (CALCIUM 600 + D PO) Take by mouth.     cholecalciferol (VITAMIN D3) 25 MCG (1000 UNIT) tablet Take 1,000 Units by mouth daily.     No current facility-administered medications on file prior to visit.    No Known Allergies  History reviewed. No pertinent past medical history.  Past Surgical History:  Procedure Laterality Date   BREAST CYST ASPIRATION Right    CHOLECYSTECTOMY      Family History  Problem Relation Age of Onset   Hypertension Mother        in assisted living   Heart disease Mother    Vascular Disease Mother        vascular dementia   Early death Father        accidental death   Colon polyps Sister    Prostate cancer Brother     Social History   Socioeconomic History   Marital status: Married    Spouse name: Rocky Link   Number of children: 2   Years of education: college   Highest education level: Not on file  Occupational History    Employer: [employer]  Tobacco Use   Smoking status: Former    Current packs/day: 0.00    Average packs/day: 0.1 packs/day for 15.0 years (1.5 ttl pk-yrs)    Types: Cigarettes    Start date: 01/18/1985    Quit date: 01/19/2000    Years since quitting: 22.6    Passive exposure: Never   Smokeless tobacco: Never  Vaping Use    Vaping status: Never Used  Substance and Sexual Activity   Alcohol use: Yes    Comment: 1-2 glasses a few times a week   Drug use: Never   Sexual activity: Yes    Partners: Male    Birth control/protection: Post-menopausal  Other Topics Concern   Not on file  Social History Narrative   09/29/20   From: [place] and [place] (20 years in [place])    Living: with husband, Rocky Link and Materials engineer   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[employer]      Family: 2 children - [place] and Scientific laboratory technician -daughter and son - no grandkids      Enjoys: quilting, hiking, kayaking and paddle boarding      Exercise: rowing machine and stretching at home - 4-5 times a week   Diet: bagel and cream cheese in the morning, skips lunch (half a sandwich), salad or meat+side dish      Safety   Seat belts: Yes    Guns: No   Safe in relationships: Yes       Social Determinants of Health   Financial Resource Strain: Low Risk  (12/10/2019)   Received from [facility], [facility]   Overall Financial Resource Strain (CARDIA)  Difficulty of Paying Living Expenses: Not hard at all  Food Insecurity: No Food Insecurity (04/30/2020)   Received from [facility], [facility]   Hunger Vital Sign    Worried About Running Out of Food in the Last Year: Never true    Ran Out of Food in the Last Year: Never true  Transportation Needs: No Transportation Needs (12/10/2019)   Received from [facility], [facility]   [facility] - Transportation    Lack of Transportation (Medical): No    Lack of Transportation (Non-Medical): No  Physical Activity: Insufficiently Active (12/10/2019)   Received from [facility], [facility]   Exercise Vital Sign    Days of Exercise per Week: 4 days    Minutes of Exercise per Session: 20 min  Stress: No Stress Concern Present (12/10/2019)   Received from [facility], [facility] - Occupational Stress Questionnaire    Feeling of Stress : Not at all   Social Connections: Unknown (05/22/2021)   Received from [facility], [facility]   Social Network    Social Network: Not on file  Intimate Partner Violence: Unknown (04/20/2021)   Received from [facility], [facility]   HITS    Physically Hurt: Not on file    Insult or Talk Down To: Not on file    Threaten Physical Harm: Not on file    Scream or Curse: Not on file   Review of Systems No change in smell or taste Appetite is off No vomiting--but some nausea    Objective:   Physical Exam Constitutional:      Appearance: Normal appearance.  HENT:     Head:     Comments: No sinus tenderness    Right Ear: Tympanic membrane and ear canal normal.     Left Ear: Tympanic membrane and ear canal normal.     Mouth/Throat:     Pharynx: No oropharyngeal exudate or posterior oropharyngeal erythema.  Cardiovascular:     Rate and Rhythm: Normal rate and regular rhythm.     Heart sounds: No murmur heard.    No gallop.  Pulmonary:     Effort: Pulmonary effort is normal.     Breath sounds: Normal breath sounds. No wheezing or rales.  Musculoskeletal:     Cervical back: Neck supple.  Lymphadenopathy:     Cervical: No cervical adenopathy.  Neurological:     Mental Status: She is alert.            Assessment & Plan:

## 2022-10-07 ENCOUNTER — Ambulatory Visit: Payer: BC Managed Care – PPO | Admitting: Dermatology

## 2022-11-05 IMAGING — MG DIGITAL DIAGNOSTIC BILAT W/ TOMO W/ CAD
6 of 10 series · 6 of 30 positions shown · non-contrast
Comparison: Previous exam(s).

CLINICAL DATA: Patient was evaluated at outside institution on
01/30/2020 with right breast ultrasound demonstrating benign simple
cyst and probably benign complicated cyst at the lower inner right
breast 9 cm from the nipple. Six-month follow-up was recommended for
the probably benign complicated cyst.

EXAM:
DIGITAL DIAGNOSTIC BILATERAL MAMMOGRAM WITH TOMOSYNTHESIS AND CAD;
ULTRASOUND RIGHT BREAST LIMITED
TECHNIQUE: Bilateral digital diagnostic mammography and breast tomosynthesis
was performed. The images were evaluated with computer-aided
detection.; Targeted ultrasound examination of the right breast was
performed

[L CC synth-2D]
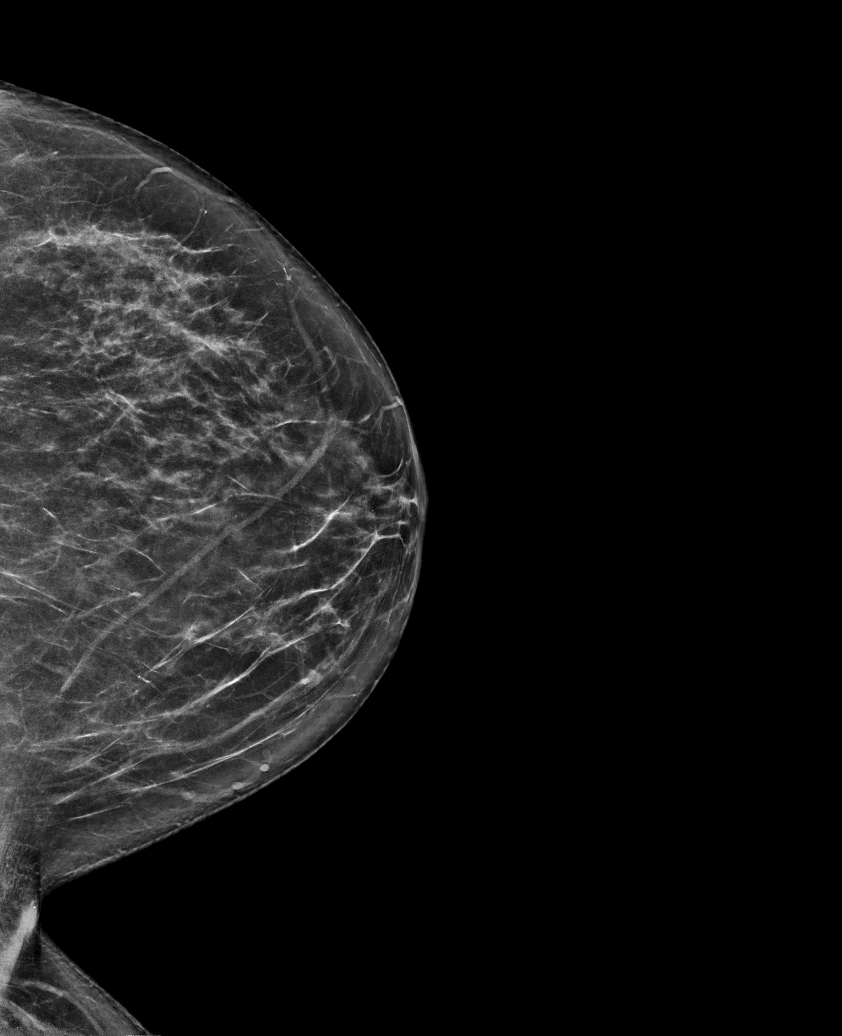

[R MLO synth-2D (1 of 2)]
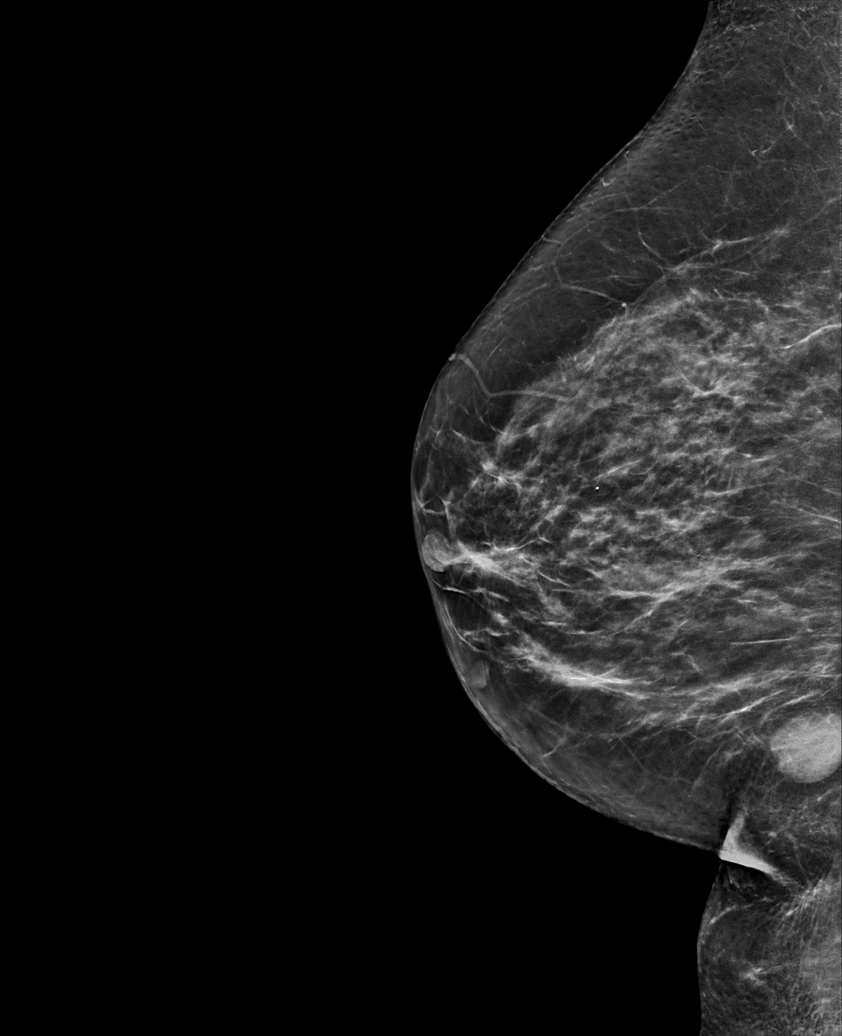

[R MLO synth-2D (2 of 2)]
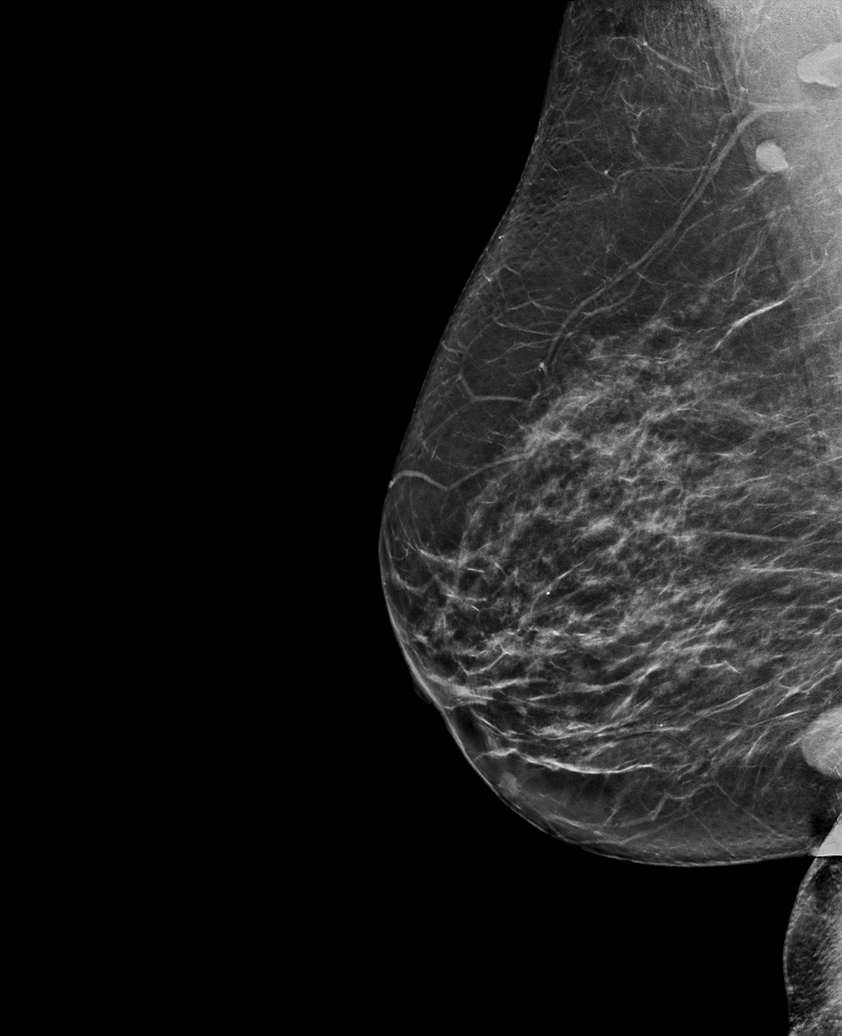

[R CC synth-2D]
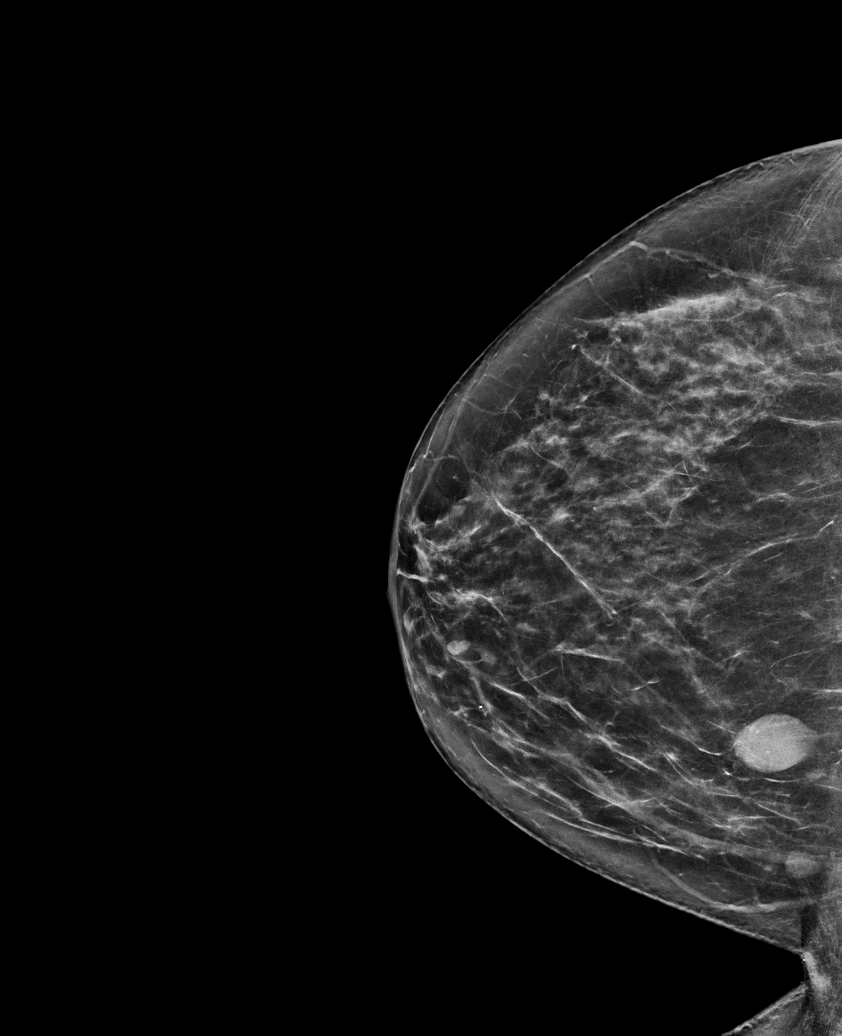

[L MLO synth-2D]
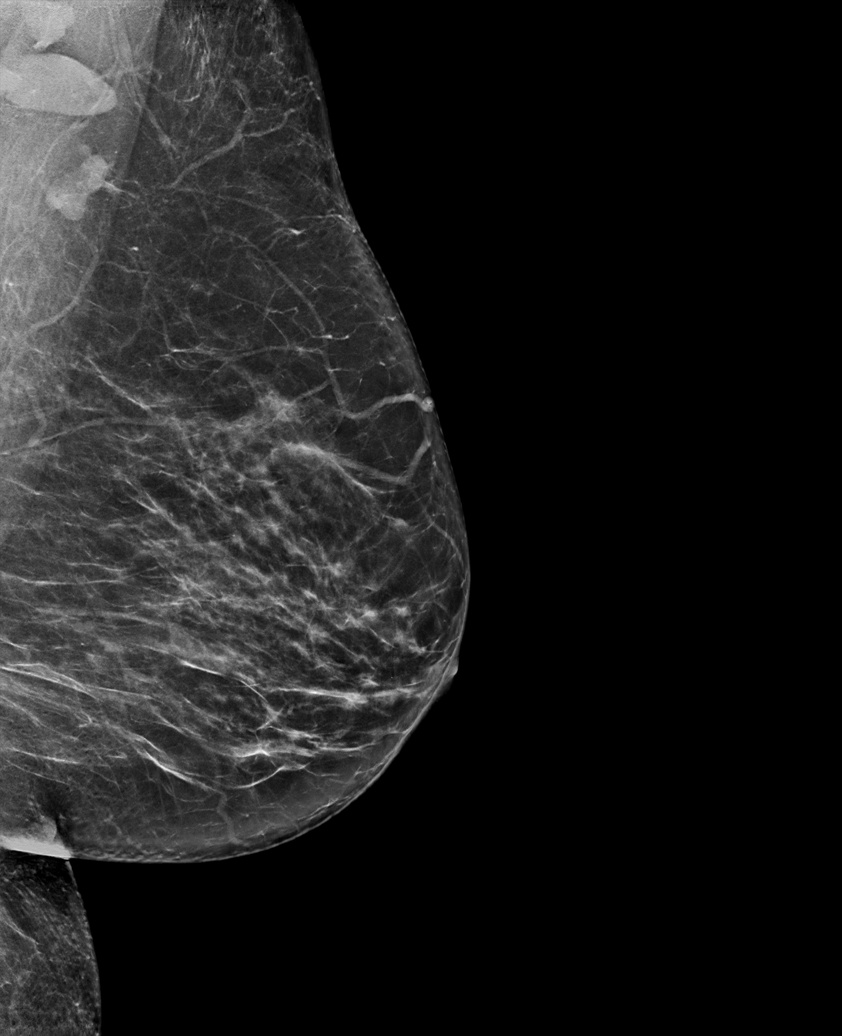

[L CC tomo · tomo slice 37/74.0]
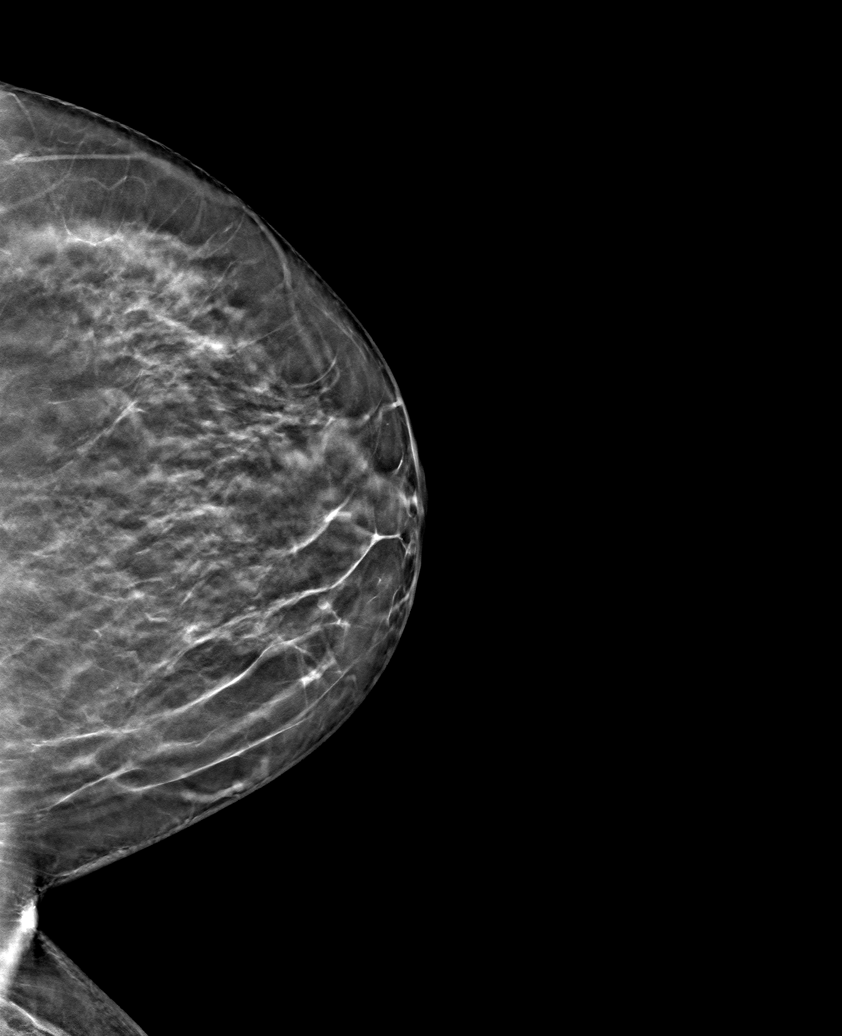

[6 of 30 positions shown; findings below may reference images not displayed]

ACR Breast Density Category b: There are scattered areas of
fibroglandular density.
FINDINGS: A 1.9 cm circumscribed oval mass is seen in the lower inner right at
posterior depth, mildly increased in size compared to prior
mammogram 12/10/2019. The round 1.1 cm mass seen at the inner
central right breast on prior mammogram has resolved.

Targeted ultrasound of the right breast at the 4 o'clock position 10
cm from the nipple demonstrates a 1.5 x 1.6 x 1.7 cm oval
circumscribed anechoic simple cyst, corresponding with the
mammographic finding. Targeted ultrasound of the inner lower right
breast from the 3 o'clock to 6 o'clock positions was performed by
the sonographer and the physician, demonstrating resolution of the
previously documented 1.1 cm complicated cyst seen on ultrasound
01/30/2020.
IMPRESSION: 1. Benign simple cyst at the right breast 4 o'clock position.
2. Interval resolution of the complicated cyst seen on prior
ultrasound 01/30/2020.
3. No mammographic findings of malignancy in either breast.

RECOMMENDATION:
Annual screening mammogram.

I have discussed the findings and recommendations with the patient.
If applicable, a reminder letter will be sent to the patient
regarding the next appointment.

BI-RADS CATEGORY  2: Benign.

## 2022-11-11 ENCOUNTER — Ambulatory Visit (INDEPENDENT_AMBULATORY_CARE_PROVIDER_SITE_OTHER): Payer: BC Managed Care – PPO

## 2022-11-11 DIAGNOSIS — Z23 Encounter for immunization: Secondary | ICD-10-CM | POA: Diagnosis not present

## 2023-01-03 ENCOUNTER — Encounter: Payer: Self-pay | Admitting: Family

## 2023-01-03 ENCOUNTER — Ambulatory Visit: Payer: BLUE CROSS/BLUE SHIELD | Admitting: Family

## 2023-01-03 VITALS — BP 128/88 | HR 73 | Temp 98.0°F | Ht 60.0 in | Wt 151.8 lb

## 2023-01-03 DIAGNOSIS — R0683 Snoring: Secondary | ICD-10-CM

## 2023-01-03 DIAGNOSIS — R5383 Other fatigue: Secondary | ICD-10-CM | POA: Diagnosis not present

## 2023-01-03 DIAGNOSIS — E782 Mixed hyperlipidemia: Secondary | ICD-10-CM | POA: Diagnosis not present

## 2023-01-03 DIAGNOSIS — R7989 Other specified abnormal findings of blood chemistry: Secondary | ICD-10-CM | POA: Diagnosis not present

## 2023-01-03 DIAGNOSIS — Z1159 Encounter for screening for other viral diseases: Secondary | ICD-10-CM | POA: Diagnosis not present

## 2023-01-03 DIAGNOSIS — G4719 Other hypersomnia: Secondary | ICD-10-CM | POA: Diagnosis not present

## 2023-01-03 DIAGNOSIS — E559 Vitamin D deficiency, unspecified: Secondary | ICD-10-CM | POA: Diagnosis not present

## 2023-01-03 DIAGNOSIS — Z87891 Personal history of nicotine dependence: Secondary | ICD-10-CM

## 2023-01-03 DIAGNOSIS — E663 Overweight: Secondary | ICD-10-CM

## 2023-01-03 DIAGNOSIS — R0609 Other forms of dyspnea: Secondary | ICD-10-CM

## 2023-01-03 LAB — TSH: TSH: 1.77 u[IU]/mL (ref 0.35–5.50)

## 2023-01-03 LAB — CBC
HCT: 45.8 % (ref 36.0–46.0)
Hemoglobin: 15.3 g/dL — ABNORMAL HIGH (ref 12.0–15.0)
MCHC: 33.4 g/dL (ref 30.0–36.0)
MCV: 93.8 fL (ref 78.0–100.0)
Platelets: 336 10*3/uL (ref 150.0–400.0)
RBC: 4.88 Mil/uL (ref 3.87–5.11)
RDW: 13.3 % (ref 11.5–15.5)
WBC: 7.6 10*3/uL (ref 4.0–10.5)

## 2023-01-03 LAB — VITAMIN D 25 HYDROXY (VIT D DEFICIENCY, FRACTURES): VITD: 30.94 ng/mL (ref 30.00–100.00)

## 2023-01-03 LAB — COMPREHENSIVE METABOLIC PANEL
ALT: 32 U/L (ref 0–35)
AST: 21 U/L (ref 0–37)
Albumin: 4.3 g/dL (ref 3.5–5.2)
Alkaline Phosphatase: 125 U/L — ABNORMAL HIGH (ref 39–117)
BUN: 14 mg/dL (ref 6–23)
CO2: 26 meq/L (ref 19–32)
Calcium: 9.4 mg/dL (ref 8.4–10.5)
Chloride: 104 meq/L (ref 96–112)
Creatinine, Ser: 0.67 mg/dL (ref 0.40–1.20)
GFR: 90.94 mL/min (ref >60.00)
Glucose, Bld: 94 mg/dL (ref 70–99)
Potassium: 4.1 meq/L (ref 3.5–5.1)
Sodium: 140 meq/L (ref 135–145)
Total Bilirubin: 0.7 mg/dL (ref 0.2–1.2)
Total Protein: 6.8 g/dL (ref 6.0–8.3)

## 2023-01-03 LAB — LIPID PANEL
Cholesterol: 233 mg/dL — ABNORMAL HIGH (ref 0–200)
HDL: 57.1 mg/dL (ref >39.00)
LDL Cholesterol: 152 mg/dL — ABNORMAL HIGH (ref 0–99)
NonHDL: 176.17
Total CHOL/HDL Ratio: 4
Triglycerides: 123 mg/dL (ref 0.0–149.0)
VLDL: 24.6 mg/dL (ref 0.0–40.0)

## 2023-01-03 LAB — VITAMIN B12: Vitamin B-12: 198 pg/mL — ABNORMAL LOW (ref 211–911)

## 2023-01-03 NOTE — Assessment & Plan Note (Signed)
R/o osa Not as likely depression due to d/w pt and evaluation

## 2023-01-03 NOTE — Assessment & Plan Note (Signed)
Smoking cessation instruction/counseling given:  commended patient for quitting and reviewed strategies for preventing relapses 

## 2023-01-03 NOTE — Assessment & Plan Note (Addendum)
Lab work first pending results.  Ordering echo for new onset dyspnea

## 2023-01-03 NOTE — Assessment & Plan Note (Signed)
Ordered vitamin d pending results.   

## 2023-01-03 NOTE — Progress Notes (Signed)
Established Patient Office Visit  Subjective:   Patient ID: Allison Lowe, female    DOB: 08/16/1955  Age: 67 y.o. MRN: [id number]  CC:  Chief Complaint  Patient presents with   Shortness of Breath    HPI: Allison Lowe is a 67 y.o. female presenting on 01/03/2023 for Shortness of Breath   Feeling tired often, excessive tiredness throughout the day. The past three days feels fine, but then others just overwhelmingly tired and run down. Often finds that she is no longer wanting to participate in hobbies such as kayaking that she typically likes to do. Denies any symptoms of depression. She does state hobbies are mainly those that are physical vs those that she can be stationary,she is happy with sewing. She states she sleeps fairly well but tends to wake up and hard to get back to sleep at times. Has been told she snores and at times she gasps for breath.  She does also notice DOE which she states never used to happen. Really feels like she is 'slowing down'. When she goes up and down stairs especially will feel sob which she did not feel before. Does not feel palpitations or flutters per her recollection. She does state she will feel tightness in her throat at times, hard to swallow at times, but this also comes and goes she states. She states she mainly notices this with thicker foods, but isn't quite sure. If she has a glass of wine sometimes will feel it too. She will notice a few times a month.   Tobacco abuse, recent cessation in the last six months.   Stop bang questionnaire  Intermediate Risk for moderate to severe OSA      ROS: Negative unless specifically indicated above in HPI.   Relevant past medical history reviewed and updated as indicated.   Allergies and medications reviewed and updated.   Current Outpatient Medications:    Calcium Carb-Cholecalciferol (CALCIUM 600 + D PO), Take by mouth., Disp: , Rfl:    cholecalciferol (VITAMIN D3) 25 MCG (1000 UNIT) tablet, Take  1,000 Units by mouth daily., Disp: , Rfl:   No Known Allergies  Objective:   BP 128/88 (BP Location: Left Arm, Patient Position: Sitting, Cuff Size: Normal)   Pulse 73   Temp 98 F (36.7 C) (Temporal)   Ht 5' (1.524 m)   Wt 151 lb 12.8 oz (68.9 kg)   SpO2 98%   BMI 29.65 kg/m    Physical Exam Constitutional:      General: She is not in acute distress.    Appearance: Normal appearance. She is normal weight. She is not ill-appearing, toxic-appearing or diaphoretic.  HENT:     Head: Normocephalic.  Cardiovascular:     Rate and Rhythm: Normal rate and regular rhythm.  Pulmonary:     Effort: Pulmonary effort is normal.     Breath sounds: Normal breath sounds.  Musculoskeletal:        General: Normal range of motion.  Neurological:     General: No focal deficit present.     Mental Status: She is alert and oriented to person, place, and time. Mental status is at baseline.  Psychiatric:        Mood and Affect: Mood normal.        Behavior: Behavior normal.        Thought Content: Thought content normal.        Judgment: Judgment normal.     Assessment & Plan:  Other fatigue Assessment &  Plan: Workup for labs and sleep study  Ddx b12 def, anemia, sleep apnea  Orders: -     Vitamin B12 -     TSH -     CBC  Mixed hyperlipidemia Assessment & Plan: Long d/w pt on statin use  Ordered lipid panel, pending results. Work on low cholesterol diet and exercise as tolerated Will let her know pending labs if statin needed  Orders: -     Lipid panel -     Lipoprotein A (LPA)  Vitamin D deficiency Assessment & Plan: Ordered vitamin d pending results.    Orders: -     VITAMIN D 25 Hydroxy (Vit-D Deficiency, Fractures)  Overweight (BMI 25.0-29.9) Assessment & Plan: Pt advised to work on diet and exercise as tolerated    Quit smoking within past year Assessment & Plan: Smoking cessation instruction/counseling given:  commended patient for quitting and reviewed  strategies for preventing relapses   Orders: -     Ambulatory Referral for Lung Cancer Scre  Snoring  Excessive daytime sleepiness Assessment & Plan: R/o osa Not as likely depression due to d/w pt and evaluation   Orders: -     Vitamin B12 -     TSH -     CBC -     Ambulatory referral to Sleep Studies  Elevated LFTs -     Comprehensive metabolic panel -     Hepatitis C antibody  History of tobacco abuse -     Ambulatory Referral for Lung Cancer Scre  Encounter for hepatitis C screening test for low risk patient -     Hepatitis C antibody  DOE (dyspnea on exertion) Assessment & Plan: Lab work first pending results.  Ordering echo for new onset dyspnea  Orders: -     ECHOCARDIOGRAM COMPLETE; Future     Follow up plan: Return in about 1 month (around 02/03/2023) for follow up doe .  Mort Sawyers, FNP

## 2023-01-03 NOTE — Assessment & Plan Note (Signed)
Workup for labs and sleep study  Ddx b12 def, anemia, sleep apnea

## 2023-01-03 NOTE — Patient Instructions (Addendum)
  Recommend daily flonase and also zyrtec at night for allergies.   ------------------------------------   A referral was placed today for sleep study. Please let us know if you have not heard back within 2 weeks about the referral.  ------------------------------------  Stop by the lab prior to leaving today. I will notify you of your results once received.   Referral to lung cancer screening program made. Please let us know if you have not heard back within 2 weeks about the referral.   Regards,   Haneef Hallquist FNP-C

## 2023-01-03 NOTE — Assessment & Plan Note (Signed)
Pt advised to work on diet and exercise as tolerated  

## 2023-01-03 NOTE — Assessment & Plan Note (Signed)
Long d/w pt on statin use  Ordered lipid panel, pending results. Work on low cholesterol diet and exercise as tolerated Will let her know pending labs if statin needed

## 2023-01-04 ENCOUNTER — Other Ambulatory Visit: Payer: Self-pay

## 2023-01-04 DIAGNOSIS — Z122 Encounter for screening for malignant neoplasm of respiratory organs: Secondary | ICD-10-CM

## 2023-01-04 DIAGNOSIS — Z87891 Personal history of nicotine dependence: Secondary | ICD-10-CM

## 2023-01-05 ENCOUNTER — Encounter: Payer: BLUE CROSS/BLUE SHIELD | Admitting: Physician Assistant

## 2023-01-05 NOTE — Progress Notes (Signed)
Vitamin B12 very low, please set up for B12 injections, 1000 mcg IM once monthly for three months, also schedule 3 month LAB ONLY appt to repeat b12. Recommend also oral otc 1000 mcg once daily vitamin B12  Cholesterol is very high, LDL is 152 and is not at goal of less than 100. I highly suggest we start cholesterol medication, is pt opposed? This significantly reduces risk for heart disease or stroke as over time the untreated cholesterol plaques and hardens the arteries.   Alk phos is very mildly elevated but not concerning we will continue to monitor. Go easy with taking tylenol, drinking alcohol and or taking herbal supplements or herbal teas.   Vitamin d borderline low. Take over the counter vitamin D3 2000 I/U once daily if not already.

## 2023-01-06 ENCOUNTER — Encounter: Payer: Self-pay | Admitting: *Deleted

## 2023-01-06 ENCOUNTER — Telehealth: Payer: Self-pay | Admitting: Family

## 2023-01-06 NOTE — Telephone Encounter (Signed)
Patient is calling in stating that her appt for the lung cancer screening was canceled and she doesn't know why are what's going on she would like a call back her appt for tomorrow was candled she would like a call back

## 2023-01-07 ENCOUNTER — Encounter: Payer: Self-pay | Admitting: *Deleted

## 2023-01-07 ENCOUNTER — Ambulatory Visit: Payer: BLUE CROSS/BLUE SHIELD

## 2023-01-07 ENCOUNTER — Telehealth: Payer: Self-pay

## 2023-01-07 NOTE — Telephone Encounter (Signed)
Returned call to patient to reschedule sdmv and LDCT. No answer.  Left VM

## 2023-01-07 NOTE — Telephone Encounter (Signed)
Has been scheduled.

## 2023-01-10 NOTE — Telephone Encounter (Signed)
Pt has been scheduled for [facility] Appt on 01/24/2023 and her CT is on 01/26/2023

## 2023-01-11 LAB — HEPATITIS C ANTIBODY: Hepatitis C Ab: NONREACTIVE

## 2023-01-11 LAB — LIPOPROTEIN A (LPA): Lipoprotein (a): 44 nmol/L (ref <75)

## 2023-01-24 ENCOUNTER — Ambulatory Visit (INDEPENDENT_AMBULATORY_CARE_PROVIDER_SITE_OTHER): Payer: BLUE CROSS/BLUE SHIELD | Admitting: Acute Care

## 2023-01-24 DIAGNOSIS — Z87891 Personal history of nicotine dependence: Secondary | ICD-10-CM | POA: Diagnosis not present

## 2023-01-24 NOTE — Patient Instructions (Signed)

## 2023-01-24 NOTE — Progress Notes (Signed)
 Provider Attestation I agree with the documentation of the Shared Decision Making visit,  smoking cessation counseling if appropriate, and verification or eligibility for lung cancer screening as documented by the RN Nurse Navigator.   Raejean Bullock, MSN, AGACNP-BC [facility] See Amion for personal pager PCCM on call pager [phone]     Virtual Visit via Telephone Note  I connected with Allison Lowe on 01/24/23 at 12:30 PM EST by telephone and verified that I am speaking with the correct person using two identifiers.  Location: Patient: in home Provider: [address] [place], [place], [address]    Shared Decision Making Visit Lung Cancer Screening Program [phone])   Eligibility: Age 68 y.o. Pack Years Smoking History Calculation 21.5 (# packs/per year x # years smoked) Recent History of coughing up blood  no Unexplained weight loss? no ( >Than 15 pounds within the last 6 months ) Prior History Lung / other cancer no (Diagnosis within the last 5 years already requiring surveillance chest CT Scans). Smoking Status Former Smoker Former Smokers: Years since quit: < 1 year  Quit Date: July 2024  Visit Components: Discussion included one or more decision making aids. yes Discussion included risk/benefits of screening. yes Discussion included potential follow up diagnostic testing for abnormal scans. yes Discussion included meaning and risk of over diagnosis. yes Discussion included meaning and risk of False Positives. yes Discussion included meaning of total radiation exposure. yes  Counseling Included: Importance of adherence to annual lung cancer LDCT screening. yes Impact of comorbidities on ability to participate in the program. yes Ability and willingness to under diagnostic treatment. yes  Smoking Cessation Counseling: Current Smokers:  Discussed importance of smoking cessation. yes Information about tobacco cessation classes  and interventions provided to patient. yes Patient provided with "ticket" for LDCT Scan. yes Symptomatic Patient. no  Counseling NA Diagnosis Code: Tobacco Use Z72.0 Asymptomatic Patient yes  Counseling (Intermediate counseling: > three minutes counseling) W2956 Former Smokers:  Discussed the importance of maintaining cigarette abstinence. yes Diagnosis Code: Personal History of Nicotine Dependence. O13.086 Information about tobacco cessation classes and interventions provided to patient. Yes Patient provided with "ticket" for LDCT Scan. yes Written Order for Lung Cancer Screening with LDCT placed in Epic. Yes (CT Chest Lung Cancer Screening Low Dose W/O CM) [id number] Z12.2-Screening of respiratory organs Z87.891-Personal history of nicotine dependence   Valentin Gaskins, RN 01/24/23

## 2023-01-26 ENCOUNTER — Ambulatory Visit
Admission: RE | Admit: 2023-01-26 | Discharge: 2023-01-26 | Disposition: A | Discharge status: Home / Self Care | Payer: BLUE CROSS/BLUE SHIELD | Source: Ambulatory Visit | Attending: Acute Care | Admitting: Acute Care

## 2023-01-26 DIAGNOSIS — Z122 Encounter for screening for malignant neoplasm of respiratory organs: Secondary | ICD-10-CM | POA: Diagnosis present

## 2023-01-26 DIAGNOSIS — Z87891 Personal history of nicotine dependence: Secondary | ICD-10-CM | POA: Insufficient documentation

## 2023-02-02 ENCOUNTER — Telehealth: Payer: Self-pay | Admitting: Family

## 2023-02-02 NOTE — Telephone Encounter (Signed)
 Home sleep study results were received on 02/02/2023. These along with an order form for [facility] have been placed in Allison Lowe's in box for review.

## 2023-02-02 NOTE — Telephone Encounter (Signed)
 We should not need a face to face after the sleep study to be able to order PAP therapy.

## 2023-02-02 NOTE — Telephone Encounter (Signed)
 Do we need to have a FACE TO FACE visit when discussing CPAP ordering and reviewing cpap results? They keep listing that on the [facility] order so I was curious

## 2023-02-03 ENCOUNTER — Ambulatory Visit: Payer: BLUE CROSS/BLUE SHIELD | Admitting: Family

## 2023-02-03 ENCOUNTER — Other Ambulatory Visit: Payer: Self-pay | Admitting: Acute Care

## 2023-02-03 DIAGNOSIS — Z122 Encounter for screening for malignant neoplasm of respiratory organs: Secondary | ICD-10-CM

## 2023-02-03 DIAGNOSIS — Z87891 Personal history of nicotine dependence: Secondary | ICD-10-CM

## 2023-02-03 NOTE — Telephone Encounter (Signed)
[facility] order form was faxed to them. Fax confirmation was received.

## 2023-02-04 ENCOUNTER — Ambulatory Visit: Payer: BLUE CROSS/BLUE SHIELD | Admitting: Family

## 2023-02-04 ENCOUNTER — Encounter: Payer: Self-pay | Admitting: Family

## 2023-02-04 VITALS — BP 124/76 | HR 76 | Temp 98.2°F | Ht 60.0 in | Wt 155.0 lb

## 2023-02-04 DIAGNOSIS — J432 Centrilobular emphysema: Secondary | ICD-10-CM | POA: Diagnosis not present

## 2023-02-04 DIAGNOSIS — G4733 Obstructive sleep apnea (adult) (pediatric): Secondary | ICD-10-CM

## 2023-02-04 DIAGNOSIS — E782 Mixed hyperlipidemia: Secondary | ICD-10-CM | POA: Diagnosis not present

## 2023-02-04 DIAGNOSIS — Z79899 Other long term (current) drug therapy: Secondary | ICD-10-CM | POA: Diagnosis not present

## 2023-02-04 MED ORDER — PRAVASTATIN SODIUM 10 MG PO TABS: 10.0000 mg | ORAL_TABLET | Freq: Every day | ORAL | 3 refills | Status: DC

## 2023-02-04 NOTE — Assessment & Plan Note (Signed)
Pt advised to:  Work on low cholesterol diet and exercise as tolerated Pt aggreeable to start statin, start pravastatin 10 mg nightly

## 2023-02-04 NOTE — Patient Instructions (Signed)
  A referral was placed today pulmonary  Please let us know if you have not heard back within 2 weeks about the referral.

## 2023-02-04 NOTE — Progress Notes (Signed)
Established Patient Office Visit  Subjective:      CC:  Chief Complaint  Patient presents with   Medical Management of Chronic Issues    HPI: Allison Lowe is a 68 y.o. female presenting on 02/04/2023 for Medical Management of Chronic Issues . CT chest lung cancer screening, performed 01/26/23.  With aortic atherosclerosis and mild diffuse bronchiat wall thickening mild centrilobular and paraseptal emphysema.   mMRC dyspnea scale, asked in office, on a scale 0-4  0 I only get breathless with strenuous exercise Have you had 2 or more moderate exacerbations or at least 1 hospitalization for COPD exacerbation in the past year? No   Does the patient have high peripheral eosinophil levels (>300 ): unknown, will order today.  HLD: not on statin meds. She is open to this now that we have seen AA on Ct.   Lab Results  Component Value Date   CHOL 233 (H) 01/03/2023   HDL 57.10 01/03/2023   LDLCALC 152 (H) 01/03/2023   TRIG 123.0 01/03/2023   CHOLHDL 4 01/03/2023       Social history:  Relevant past medical, surgical, family and social history reviewed and updated as indicated. Interim medical history since our last visit reviewed.  Allergies and medications reviewed and updated.  DATA REVIEWED: CHART IN EPIC     ROS: Negative unless specifically indicated above in HPI.    Current Outpatient Medications:    Calcium Carb-Cholecalciferol (CALCIUM 600 + D PO), Take by mouth., Disp: , Rfl:    cholecalciferol (VITAMIN D3) 25 MCG (1000 UNIT) tablet, Take 1,000 Units by mouth daily., Disp: , Rfl:    pravastatin (PRAVACHOL) 10 MG tablet, Take 1 tablet (10 mg total) by mouth daily., Disp: 90 tablet, Rfl: 3      Objective:    BP 124/76 (BP Location: Left Arm, Patient Position: Sitting, Cuff Size: Normal)   Pulse 76   Temp 98.2 F (36.8 C) (Temporal)   Ht 5' (1.524 m)   Wt 155 lb (70.3 kg)   SpO2 97%   BMI 30.27 kg/m   Wt Readings from Last 3 Encounters:  02/04/23 155  lb (70.3 kg)  01/03/23 151 lb 12.8 oz (68.9 kg)  09/13/22 151 lb (68.5 kg)    Physical Exam Constitutional:      General: She is not in acute distress.    Appearance: Normal appearance. She is normal weight. She is not ill-appearing, toxic-appearing or diaphoretic.  HENT:     Head: Normocephalic.  Cardiovascular:     Rate and Rhythm: Normal rate and regular rhythm.  Pulmonary:     Effort: Pulmonary effort is normal.     Breath sounds: Normal breath sounds.  Musculoskeletal:        General: Normal range of motion.     Right lower leg: No edema.     Left lower leg: No edema.  Neurological:     General: No focal deficit present.     Mental Status: She is alert and oriented to person, place, and time. Mental status is at baseline.  Psychiatric:        Mood and Affect: Mood normal.        Behavior: Behavior normal.        Thought Content: Thought content normal.        Judgment: Judgment normal.           Assessment & Plan:  Mixed hyperlipidemia Assessment & Plan: Pt advised to:  Work on low cholesterol diet  and exercise as tolerated Pt aggreeable to start statin, start pravastatin 10 mg nightly   Orders: -     Pravastatin Sodium; Take 1 tablet (10 mg total) by mouth daily.  Dispense: 90 tablet; Refill: 3  Centrilobular emphysema (HCC) Assessment & Plan: Currently asymptomatic however will benefit from PFT/spirometry  Discussed what to look out for  Copd grading completed   Orders: -     Ambulatory referral to Pulmonology  On statin therapy -     Hepatic function panel; Future  OSA on CPAP Assessment & Plan: New finding with sleep study  Start cpap therapy       Return in about 6 months (around 08/04/2023) for f/u cholesterol, f/u CPE.  Mort Sawyers, MSN, APRN, FNP-C [facility]

## 2023-02-04 NOTE — Assessment & Plan Note (Signed)
New finding with sleep study  Start cpap therapy

## 2023-02-04 NOTE — Assessment & Plan Note (Signed)
Currently asymptomatic however will benefit from PFT/spirometry  Discussed what to look out for  Copd grading completed

## 2023-02-09 ENCOUNTER — Encounter: Payer: Self-pay | Admitting: Family

## 2023-02-09 DIAGNOSIS — I700 Atherosclerosis of aorta: Secondary | ICD-10-CM | POA: Insufficient documentation

## 2023-02-16 ENCOUNTER — Ambulatory Visit: Payer: BLUE CROSS/BLUE SHIELD | Admitting: Pulmonary Disease

## 2023-02-16 ENCOUNTER — Encounter: Payer: Self-pay | Admitting: Pulmonary Disease

## 2023-02-16 ENCOUNTER — Encounter: Payer: Self-pay | Admitting: *Deleted

## 2023-02-16 VITALS — BP 132/82 | HR 72 | Temp 97.1°F | Ht 60.0 in | Wt 154.6 lb

## 2023-02-16 DIAGNOSIS — J439 Emphysema, unspecified: Secondary | ICD-10-CM | POA: Diagnosis not present

## 2023-02-16 DIAGNOSIS — R0602 Shortness of breath: Secondary | ICD-10-CM

## 2023-02-18 ENCOUNTER — Encounter: Payer: Self-pay | Admitting: Family

## 2023-02-20 NOTE — Progress Notes (Signed)
   Synopsis: Referred in by Mort Sawyers, FNP   Subjective:   PATIENT ID: Allison Lowe GENDER: female DOB: Mar 12, 1955, MRN: [id number]  Chief Complaint  Patient presents with   Consult    No SOB, wheezing or cough.    HPI Allison Lowe is a 68 year old pleasant female patient with a past medical history of tobacco use, hyperlipidemia, OSA on CPAP, presenting today  to the pulmonary clinic for further evaluation of emphysema seen on LDCT.  She minimally symptomatic. Previous smoker 22 ppy. Quit in early 2000s. Major symptoms are shortness of breath on strenuous exercise or going uphill. She denies any cough, sputum production, wheezing or chest tightness.   CT Chest Lung Cancer screening 01/26/2023 - No suspicious nodules. Mild diffuse bronchial wall thickening with very mild centrilobular and paraseptal emphysema.   ROS All systems were reviewed and are negative except for the above.  Objective:   Vitals:   02/16/23 1548  BP: 132/82  Pulse: 72  Temp: (!) 97.1 F (36.2 C)  SpO2: 99%  Weight: 154 lb 9.6 oz (70.1 kg)  Height: 5' (1.524 m)   99% on RA BMI Readings from Last 3 Encounters:  02/16/23 30.19 kg/m  02/04/23 30.27 kg/m  01/03/23 29.65 kg/m   Wt Readings from Last 3 Encounters:  02/16/23 154 lb 9.6 oz (70.1 kg)  02/04/23 155 lb (70.3 kg)  01/03/23 151 lb 12.8 oz (68.9 kg)    Physical Exam GEN: NAD, Healthy Appearing HEENT: Supple Neck, Reactive Pupils, EOMI  CVS: Normal S1, Normal S2, RRR, No murmurs or ES appreciated  Lungs: Clear bilateral air entry.  Abdomen: Soft, non tender, non distended, + BS  Extremities: Warm and well perfused, No edema  Skin: No suspicious lesions appreciated  Psych: Normal Affect  Ancillary Information   CBC    Component Value Date/Time   WBC 7.6 01/03/2023 0925   RBC 4.88 01/03/2023 0925   HGB 15.3 (H) 01/03/2023 0925   HCT 45.8 01/03/2023 0925   PLT 336.0 01/03/2023 0925   MCV 93.8 01/03/2023 0925   MCHC 33.4  01/03/2023 0925   RDW 13.3 01/03/2023 0925   LYMPHSABS 2.5 11/19/2021 1157   MONOABS 0.4 11/19/2021 1157   EOSABS 0.1 11/19/2021 1157   BASOSABS 0.1 11/19/2021 1157   Labs and imaging were reviewed.      No data to display           Assessment & Plan:  Ms. Barradas is a 68 year old pleasant female patient with a past medical history of tobacco use, hyperlipidemia, OSA on CPAP, presenting today  to the pulmonary clinic for further evaluation of emphysema seen on LDCT.  We discussed that findings of emphysema on CT chest does not mean she has COPD. She is very minimally symptomatic. I will obtain PFTs to evaluate the presence and degree of COPD if present. If so than we will start on a LABA or LAMA. Otherwise she will continue with LDCT.  Return in about 3 months (around 05/17/2023).  I spent 60 minutes caring for this patient today, including preparing to see the patient, obtaining a medical history , reviewing a separately obtained history, performing a medically appropriate examination and/or evaluation, counseling and educating the patient/family/caregiver, ordering medications, tests, or procedures, documenting clinical information in the electronic health record, and independently interpreting results (not separately reported/billed) and communicating results to the patient/family/caregiver  Janann Colonel, MD [facility] 02/20/2023 2:20 PM

## 2023-02-21 ENCOUNTER — Encounter: Payer: Self-pay | Admitting: Family

## 2023-03-02 ENCOUNTER — Telehealth: Payer: Self-pay | Admitting: Family

## 2023-03-02 NOTE — Telephone Encounter (Signed)
Updated pt through her most recent MyChart message about her CPAP.

## 2023-03-02 NOTE — Telephone Encounter (Signed)
Noted

## 2023-03-02 NOTE — Telephone Encounter (Signed)
I was speaking with the patient about a referral and she asked me about her CPAP machine and when I thought she would be getting it. She states that she heard from [facility] about 2 weeks ago and they stated that they were working on everything but she has yet to hear back from them or receive her CPAP Machine.   She is wanting you all to please follow up with [facility].   Please advise, thanks.

## 2023-03-18 ENCOUNTER — Ambulatory Visit
Admission: RE | Admit: 2023-03-18 | Discharge: 2023-03-18 | Disposition: A | Discharge status: Home / Self Care | Payer: BLUE CROSS/BLUE SHIELD | Source: Ambulatory Visit | Attending: Family | Admitting: Family

## 2023-03-18 DIAGNOSIS — R0609 Other forms of dyspnea: Secondary | ICD-10-CM | POA: Insufficient documentation

## 2023-03-18 DIAGNOSIS — I071 Rheumatic tricuspid insufficiency: Secondary | ICD-10-CM | POA: Diagnosis not present

## 2023-03-18 LAB — ECHOCARDIOGRAM COMPLETE
AR max vel: 2.55 cm2
AV Area VTI: 2.32 cm2
AV Area mean vel: 2.27 cm2
AV Mean grad: 2.5 mmHg
AV Peak grad: 3.9 mmHg
Ao pk vel: 0.98 m/s
Area-P 1/2: 2.58 cm2
MV VTI: 2.49 cm2
S' Lateral: 3.3 cm

## 2023-03-18 NOTE — Progress Notes (Signed)
*  PRELIMINARY RESULTS* Echocardiogram 2D Echocardiogram has been performed.  Cristela Blue 03/18/2023, 11:39 AM

## 2023-03-21 ENCOUNTER — Encounter: Payer: Self-pay | Admitting: Family

## 2023-05-05 ENCOUNTER — Other Ambulatory Visit (INDEPENDENT_AMBULATORY_CARE_PROVIDER_SITE_OTHER): Payer: BLUE CROSS/BLUE SHIELD

## 2023-05-05 ENCOUNTER — Encounter: Payer: Self-pay | Admitting: Family

## 2023-05-05 DIAGNOSIS — Z79899 Other long term (current) drug therapy: Secondary | ICD-10-CM

## 2023-05-05 LAB — HEPATIC FUNCTION PANEL
ALT: 17 U/L (ref 0–35)
AST: 14 U/L (ref 0–37)
Albumin: 4.4 g/dL (ref 3.5–5.2)
Alkaline Phosphatase: 105 U/L (ref 39–117)
Bilirubin, Direct: 0.1 mg/dL (ref 0.0–0.3)
Total Bilirubin: 0.5 mg/dL (ref 0.2–1.2)
Total Protein: 6.8 g/dL (ref 6.0–8.3)

## 2023-06-06 ENCOUNTER — Other Ambulatory Visit: Payer: Self-pay

## 2023-06-06 DIAGNOSIS — R0602 Shortness of breath: Secondary | ICD-10-CM

## 2023-06-08 ENCOUNTER — Encounter: Payer: Self-pay | Admitting: Pulmonary Disease

## 2023-06-08 ENCOUNTER — Ambulatory Visit: Payer: BLUE CROSS/BLUE SHIELD | Admitting: Pulmonary Disease

## 2023-06-08 ENCOUNTER — Ambulatory Visit (INDEPENDENT_AMBULATORY_CARE_PROVIDER_SITE_OTHER): Admitting: Pulmonary Disease

## 2023-06-08 VITALS — BP 118/68 | HR 66 | Ht 61.0 in | Wt 159.0 lb

## 2023-06-08 DIAGNOSIS — J4520 Mild intermittent asthma, uncomplicated: Secondary | ICD-10-CM

## 2023-06-08 DIAGNOSIS — J439 Emphysema, unspecified: Secondary | ICD-10-CM | POA: Diagnosis not present

## 2023-06-08 DIAGNOSIS — Z87891 Personal history of nicotine dependence: Secondary | ICD-10-CM

## 2023-06-08 DIAGNOSIS — R0602 Shortness of breath: Secondary | ICD-10-CM

## 2023-06-08 LAB — PULMONARY FUNCTION TEST
DL/VA % pred: 102 %
DL/VA: 4.37 ml/min/mmHg/L
DLCO unc % pred: 99 %
DLCO unc: 17.39 ml/min/mmHg
FEF 25-75 Post: 2.79 L/s
FEF 25-75 Pre: 2.04 L/s
FEF2575-%Change-Post: 36 %
FEF2575-%Pred-Post: 149 %
FEF2575-%Pred-Pre: 109 %
FEV1-%Change-Post: 8 %
FEV1-%Pred-Post: 105 %
FEV1-%Pred-Pre: 97 %
FEV1-Post: 2.2 L
FEV1-Pre: 2.02 L
FEV1FVC-%Change-Post: 5 %
FEV1FVC-%Pred-Pre: 106 %
FEV6-%Change-Post: 2 %
FEV6-%Pred-Post: 96 %
FEV6-%Pred-Pre: 93 %
FEV6-Post: 2.53 L
FEV6-Pre: 2.46 L
FEV6FVC-%Pred-Post: 104 %
FEV6FVC-%Pred-Pre: 104 %
FVC-%Change-Post: 2 %
FVC-%Pred-Post: 92 %
FVC-%Pred-Pre: 90 %
FVC-Post: 2.53 L
FVC-Pre: 2.47 L
Post FEV1/FVC ratio: 87 %
Post FEV6/FVC ratio: 100 %
Pre FEV1/FVC ratio: 82 %
Pre FEV6/FVC Ratio: 100 %
RV % pred: 103 %
RV: 2.05 L
TLC % pred: 102 %
TLC: 4.71 L

## 2023-06-08 MED ORDER — ALBUTEROL SULFATE HFA 108 (90 BASE) MCG/ACT IN AERS: 2.0000 | INHALATION_SPRAY | Freq: Four times a day (QID) | RESPIRATORY_TRACT | 2 refills | Status: DC | PRN

## 2023-06-08 NOTE — Progress Notes (Signed)
 Full PFT completed today ? ?

## 2023-06-08 NOTE — Patient Instructions (Signed)
 Full PFT completed today ? ?

## 2023-06-08 NOTE — Progress Notes (Signed)
 Synopsis: Referred in by Felicita Horns, FNP   Subjective:   PATIENT ID: Allison Lowe GENDER: female DOB: 06/30/1955, MRN: [id number]  Chief Complaint  Patient presents with   Shortness of Breath    HPI Allison Lowe is a 68 year old pleasant female patient with a past medical history of tobacco use, hyperlipidemia, OSA on CPAP, presenting today  to the pulmonary clinic for further evaluation of emphysema seen on LDCT.  She minimally symptomatic. Previous smoker 22 ppy. Quit in early 2000s. Major symptoms are shortness of breath on strenuous exercise or going uphill. She denies any cough, sputum production, wheezing or chest tightness.   CT Chest Lung Cancer screening 01/26/2023 - No suspicious nodules. Mild diffuse bronchial wall thickening with very mild centrilobular and paraseptal emphysema.   OV 06/08/2023 - Patient is here to discuss PFTs results that are overall normal, maybe some positive reactivity to bronchodilator but does not meet ATS criteria. I will send her Albuterol prescription to use 15 min before exercise if needed.   ROS All systems were reviewed and are negative except for the above.  Objective:   Vitals:   06/08/23 1428  BP: 118/68  Pulse: 66  SpO2: 98%  Weight: 159 lb (72.1 kg)  Height: 5\' 1"  (1.549 m)   98% on RA BMI Readings from Last 3 Encounters:  06/08/23 30.04 kg/m  06/08/23 30.04 kg/m  02/16/23 30.19 kg/m   Wt Readings from Last 3 Encounters:  06/08/23 159 lb (72.1 kg)  06/08/23 159 lb (72.1 kg)  02/16/23 154 lb 9.6 oz (70.1 kg)    Physical Exam GEN: NAD, Healthy Appearing HEENT: Supple Neck, Reactive Pupils, EOMI  CVS: Normal S1, Normal S2, RRR, No murmurs or ES appreciated  Lungs: Clear bilateral air entry.  Abdomen: Soft, non tender, non distended, + BS  Extremities: Warm and well perfused, No edema  Skin: No suspicious lesions appreciated  Psych: Normal Affect  Ancillary Information   CBC    Component Value Date/Time   WBC  7.6 01/03/2023 0925   RBC 4.88 01/03/2023 0925   HGB 15.3 (H) 01/03/2023 0925   HCT 45.8 01/03/2023 0925   PLT 336.0 01/03/2023 0925   MCV 93.8 01/03/2023 0925   MCHC 33.4 01/03/2023 0925   RDW 13.3 01/03/2023 0925   LYMPHSABS 2.5 11/19/2021 1157   MONOABS 0.4 11/19/2021 1157   EOSABS 0.1 11/19/2021 1157   BASOSABS 0.1 11/19/2021 1157   Labs and imaging were reviewed.     Latest Ref Rng & Units 06/08/2023    1:40 PM  PFT Results  FVC-Pre L 2.47  P  FVC-Predicted Pre % 90  P  FVC-Post L 2.53  P  FVC-Predicted Post % 92  P  Pre FEV1/FVC % % 82  P  Post FEV1/FCV % % 87  P  FEV1-Pre L 2.02  P  FEV1-Predicted Pre % 97  P  FEV1-Post L 2.20  P  DLCO uncorrected ml/min/mmHg 17.39  P  DLCO UNC% % 99  P  DLVA Predicted % 102  P  TLC L 4.71  P  TLC % Predicted % 102  P  RV % Predicted % 103  P    P Preliminary result     Assessment & Plan:  Allison Lowe is a 68 year old pleasant female patient with a past medical history of tobacco use, hyperlipidemia, OSA on CPAP, presenting today  to the pulmonary clinic for further evaluation of emphysema seen on LDCT.  We discussed  that findings of emphysema on CT chest does not mean she has COPD. She is very minimally symptomatic. PFTs without any signs of COPD but possibly some complonent of asthma or EIA. I advised her to use albuterol 15 min before exercise and see if she feels any difference.   Otherwise will resume LDCT screening with next CT chest in 2026.   RTC 12 months.    I spent 20 minutes caring for this patient today, including preparing to see the patient, obtaining a medical history , reviewing a separately obtained history, performing a medically appropriate examination and/or evaluation, counseling and educating the patient/family/caregiver, ordering medications, tests, or procedures, documenting clinical information in the electronic health record, and independently interpreting results (not separately reported/billed) and  communicating results to the patient/family/caregiver  Allison Kindler, MD [facility] 06/08/2023 3:03 PM

## 2023-08-04 ENCOUNTER — Encounter: Payer: Self-pay | Admitting: Family

## 2023-08-04 ENCOUNTER — Ambulatory Visit (INDEPENDENT_AMBULATORY_CARE_PROVIDER_SITE_OTHER): Payer: BLUE CROSS/BLUE SHIELD | Admitting: Family

## 2023-08-04 VITALS — BP 104/74 | HR 71 | Temp 98.1°F | Ht 60.25 in | Wt 157.0 lb

## 2023-08-04 DIAGNOSIS — Z0000 Encounter for general adult medical examination without abnormal findings: Secondary | ICD-10-CM | POA: Diagnosis not present

## 2023-08-04 DIAGNOSIS — E782 Mixed hyperlipidemia: Secondary | ICD-10-CM

## 2023-08-04 DIAGNOSIS — Z1231 Encounter for screening mammogram for malignant neoplasm of breast: Secondary | ICD-10-CM | POA: Diagnosis not present

## 2023-08-04 DIAGNOSIS — J432 Centrilobular emphysema: Secondary | ICD-10-CM | POA: Diagnosis not present

## 2023-08-04 DIAGNOSIS — R7989 Other specified abnormal findings of blood chemistry: Secondary | ICD-10-CM | POA: Diagnosis not present

## 2023-08-04 DIAGNOSIS — G4733 Obstructive sleep apnea (adult) (pediatric): Secondary | ICD-10-CM

## 2023-08-04 DIAGNOSIS — E559 Vitamin D deficiency, unspecified: Secondary | ICD-10-CM

## 2023-08-04 DIAGNOSIS — Z6830 Body mass index (BMI) 30.0-30.9, adult: Secondary | ICD-10-CM

## 2023-08-04 DIAGNOSIS — E538 Deficiency of other specified B group vitamins: Secondary | ICD-10-CM | POA: Diagnosis not present

## 2023-08-04 DIAGNOSIS — C4491 Basal cell carcinoma of skin, unspecified: Secondary | ICD-10-CM

## 2023-08-04 DIAGNOSIS — Z13220 Encounter for screening for lipoid disorders: Secondary | ICD-10-CM

## 2023-08-04 LAB — CBC
HCT: 45.4 % (ref 36.0–46.0)
Hemoglobin: 15.2 g/dL — ABNORMAL HIGH (ref 12.0–15.0)
MCHC: 33.4 g/dL (ref 30.0–36.0)
MCV: 91.6 fl (ref 78.0–100.0)
Platelets: 304 K/uL (ref 150.0–400.0)
RBC: 4.96 Mil/uL (ref 3.87–5.11)
RDW: 13.3 % (ref 11.5–15.5)
WBC: 7.7 K/uL (ref 4.0–10.5)

## 2023-08-04 LAB — COMPREHENSIVE METABOLIC PANEL WITH GFR
ALT: 30 U/L (ref 0–35)
AST: 25 U/L (ref 0–37)
Albumin: 4.6 g/dL (ref 3.5–5.2)
Alkaline Phosphatase: 117 U/L (ref 39–117)
BUN: 11 mg/dL (ref 6–23)
CO2: 28 meq/L (ref 19–32)
Calcium: 9.4 mg/dL (ref 8.4–10.5)
Chloride: 103 meq/L (ref 96–112)
Creatinine, Ser: 0.66 mg/dL (ref 0.40–1.20)
GFR: 90.9 mL/min (ref >60.00)
Glucose, Bld: 94 mg/dL (ref 70–99)
Potassium: 4.1 meq/L (ref 3.5–5.1)
Sodium: 140 meq/L (ref 135–145)
Total Bilirubin: 0.7 mg/dL (ref 0.2–1.2)
Total Protein: 6.9 g/dL (ref 6.0–8.3)

## 2023-08-04 LAB — LIPID PANEL
Cholesterol: 201 mg/dL — ABNORMAL HIGH (ref 0–200)
HDL: 54.8 mg/dL (ref >39.00)
LDL Cholesterol: 114 mg/dL — ABNORMAL HIGH (ref 0–99)
NonHDL: 146.3
Total CHOL/HDL Ratio: 4
Triglycerides: 161 mg/dL — ABNORMAL HIGH (ref 0.0–149.0)
VLDL: 32.2 mg/dL (ref 0.0–40.0)

## 2023-08-04 LAB — VITAMIN D 25 HYDROXY (VIT D DEFICIENCY, FRACTURES): VITD: 31.6 ng/mL (ref 30.00–100.00)

## 2023-08-04 LAB — B12 AND FOLATE PANEL
Folate: 10.6 ng/mL (ref >5.9)
Vitamin B-12: 200 pg/mL — ABNORMAL LOW (ref 211–911)

## 2023-08-04 NOTE — Patient Instructions (Signed)
  I have sent an electronic order over to your preferred location for the following:   []   2D Mammogram  [x]   3D Mammogram  []   Bone Density   Please give this center a call to get scheduled at your convenience.  [x]   [facility]  [address] [place] [address]  [phone]  Make sure to wear two piece  clothing  No lotions powders or deodorants the day of the appointment Make sure to bring picture ID and insurance card.  Bring list of medications you are currently taking including any supplements.   ------------------------------------

## 2023-08-04 NOTE — Assessment & Plan Note (Signed)
 compliant

## 2023-08-04 NOTE — Progress Notes (Signed)
 Subjective:  Patient ID: Allison Lowe, female    DOB: 1955/04/13  Age: 68 y.o. MRN: [id number]  Patient Care Team: Corwin Antu, FNP as PCP - General (Family Medicine)   CC:  Chief Complaint  Patient presents with   Annual Exam    HPI Allison Lowe is a 68 y.o. female who presents today for an annual physical exam. She reports consuming a general diet. The patient does not participate in regular exercise at present. She generally feels well. She reports sleeping well. She does not have additional problems to discuss today.   Vision:Within last year Dental:Receives regular dental care  Lung Cancer Screening with low-dose Chest CT: last done in 01/26/23, mild emphysema  Mammogram: 03/18/22 Last pap: > 20 y/o  Colonoscopy: 03/14/16 every ten years Bone density scan: 03/16/22 osteopenia left neck femur , taking calcium vitamin D  daily   Pt is without acute concerns.   Advanced Directives Patient does have advanced directives. She does not have a copy in the electronic medical record.   DEPRESSION SCREENING    08/04/2023    7:35 AM 02/04/2023   10:14 AM 01/03/2023    8:52 AM 11/19/2021   11:14 AM 09/29/2020    3:16 PM  PHQ 2/9 Scores  PHQ - 2 Score 0 0 0 0 1  PHQ- 9 Score  0 5 0 5     ROS: Negative unless specifically indicated above in HPI.    Current Outpatient Medications:    Calcium Carb-Cholecalciferol (CALCIUM 600 + D PO), Take by mouth., Disp: , Rfl:    cholecalciferol (VITAMIN D3) 25 MCG (1000 UNIT) tablet, Take 1,000 Units by mouth daily., Disp: , Rfl:    pravastatin  (PRAVACHOL ) 10 MG tablet, Take 1 tablet (10 mg total) by mouth daily., Disp: 90 tablet, Rfl: 3    Objective:    BP 104/74 (BP Location: Left Arm, Patient Position: Sitting, Cuff Size: Normal)   Pulse 71   Temp 98.1 F (36.7 C) (Oral)   Ht 5' 0.25 (1.53 m)   Wt 157 lb (71.2 kg)   SpO2 97%   BMI 30.41 kg/m   BP Readings from Last 3 Encounters:  08/04/23 104/74  06/08/23 118/68  02/16/23 132/82       Physical Exam Constitutional:      General: She is not in acute distress.    Appearance: Normal appearance. She is normal weight. She is not ill-appearing.  HENT:     Head: Normocephalic.     Right Ear: Tympanic membrane normal.     Left Ear: Tympanic membrane normal.     Nose: Nose normal.     Mouth/Throat:     Mouth: Mucous membranes are moist.  Eyes:     Extraocular Movements: Extraocular movements intact.     Pupils: Pupils are equal, round, and reactive to light.  Cardiovascular:     Rate and Rhythm: Normal rate and regular rhythm.  Pulmonary:     Effort: Pulmonary effort is normal.     Breath sounds: Normal breath sounds.  Abdominal:     General: Abdomen is flat. Bowel sounds are normal.     Palpations: Abdomen is soft.     Tenderness: There is no guarding or rebound.  Musculoskeletal:        General: Normal range of motion.     Cervical back: Normal range of motion.  Skin:    General: Skin is warm.     Capillary Refill: Capillary refill takes less than 2 seconds.  Neurological:     General: No focal deficit present.     Mental Status: She is alert.  Psychiatric:        Mood and Affect: Mood normal.        Behavior: Behavior normal.        Thought Content: Thought content normal.        Judgment: Judgment normal.    White raised scaley lesion right lower ext directly inferior to patella  Left lower back with flat brown pigmented lesion        Assessment & Plan:  Centrilobular emphysema (HCC) Assessment & Plan: Has been asymptomatic Has albuterol  prn has not had to use.   Screening mammogram for breast cancer -     3D Screening Mammogram, Left and Right; Future  Elevated LFTs -     Comprehensive metabolic panel with GFR  Vitamin B12 deficiency -     CBC -     B12 and Folate Panel  Vitamin D  deficiency Assessment & Plan: Ordered vitamin d  pending results.    Orders: -     VITAMIN D  25 Hydroxy (Vit-D Deficiency, Fractures)  Screening  for lipoid disorders -     Lipid panel  Encounter for general adult medical examination without abnormal findings Assessment & Plan: Patient Counseling(The following topics were reviewed):  Preventative care handout given to pt  Health maintenance and immunizations reviewed. Please refer to Health maintenance section. Pt advised on safe sex, wearing seatbelts in car, and proper nutrition labwork ordered today for annual Dental health: Discussed importance of regular tooth brushing, flossing, and dental visits.    OSA on CPAP Assessment & Plan: compliant   Mixed hyperlipidemia Assessment & Plan: Ordered lipid panel, pending results. Work on low cholesterol diet and exercise as tolerated. Continue pravastatin  10 mg once daily     BMI 30.0-30.9,adult  Basal cell carcinoma (BCC), unspecified site -     Ambulatory referral to Dermatology      Follow-up: Return in about 1 year (around 08/03/2024) for f/u CPE.   Ginger Patrick, FNP

## 2023-08-04 NOTE — Assessment & Plan Note (Signed)
 Ordered vitamin d pending results.

## 2023-08-04 NOTE — Assessment & Plan Note (Signed)
 Has been asymptomatic Has albuterol  prn has not had to use.

## 2023-08-04 NOTE — Assessment & Plan Note (Addendum)
 Ordered lipid panel, pending results. Work on low cholesterol diet and exercise as tolerated. Continue pravastatin  10 mg once daily

## 2023-08-04 NOTE — Assessment & Plan Note (Signed)

## 2023-08-08 ENCOUNTER — Ambulatory Visit: Payer: Self-pay | Admitting: Family

## 2024-01-02 ENCOUNTER — Ambulatory Visit: Admitting: Physician Assistant

## 2024-01-02 ENCOUNTER — Encounter: Payer: Self-pay | Admitting: Physician Assistant

## 2024-01-02 VITALS — BP 135/88 | HR 50

## 2024-01-02 DIAGNOSIS — Z1283 Encounter for screening for malignant neoplasm of skin: Secondary | ICD-10-CM | POA: Diagnosis not present

## 2024-01-02 DIAGNOSIS — W908XXA Exposure to other nonionizing radiation, initial encounter: Secondary | ICD-10-CM | POA: Diagnosis not present

## 2024-01-02 DIAGNOSIS — D1801 Hemangioma of skin and subcutaneous tissue: Secondary | ICD-10-CM

## 2024-01-02 DIAGNOSIS — L814 Other melanin hyperpigmentation: Secondary | ICD-10-CM

## 2024-01-02 DIAGNOSIS — L578 Other skin changes due to chronic exposure to nonionizing radiation: Secondary | ICD-10-CM

## 2024-01-02 DIAGNOSIS — L821 Other seborrheic keratosis: Secondary | ICD-10-CM

## 2024-01-02 DIAGNOSIS — D489 Neoplasm of uncertain behavior, unspecified: Secondary | ICD-10-CM

## 2024-01-02 NOTE — Patient Instructions (Signed)

## 2024-01-02 NOTE — Progress Notes (Deleted)
° °  New Patient Visit   Subjective  Allison Lowe is a 68 y.o. female who presents for the following: ***  Patient states she has *** located at the {LOCATION ON BODY:21951} that she would like to have examined. Patient reports the areas have been there for {NUMBER 1-10:22536} {Time; units week/month/year w plurals:19499}. She reports the areas {Actions; are/are not:16769} bothersome.Patient rates irritation {NUMBER 1-10:22536} out of 10. She states that the areas {ACTIONS; HAVE/HAVE NOT:19434} spread. Patient reports she {HAS HAS WNU:81165} previously been treated for these areas. Patient *** Hx of bx. Patient *** family history of skin cancer(s).  The patient has spots, moles and lesions to be evaluated, some may be new or changing and the patient may have concern these could be cancer.   The following portions of the chart were reviewed this encounter and updated as appropriate: medications, allergies, medical history  Review of Systems:  No other skin or systemic complaints except as noted in HPI or Assessment and Plan.  Objective  Well appearing patient in no apparent distress; mood and affect are within normal limits.  ***A full examination was performed including scalp, head, eyes, ears, nose, lips, neck, chest, axillae, abdomen, back, buttocks, bilateral upper extremities, bilateral lower extremities, hands, feet, fingers, toes, fingernails, and toenails. All findings within normal limits unless otherwise noted below.  ***A focused examination was performed of the following areas: ***  Relevant exam findings are noted in the Assessment and Plan.    Assessment & Plan       Return in about 1 year (around 01/01/2025) for TBSE.  I, Doyce Pan, CMA, am acting as scribe for SANDRIDGE,BRENDA K, PA-C.   Documentation: I have reviewed the above documentation for accuracy and completeness, and I agree with the above.  SANDRIDGE,BRENDA K, PA-C

## 2024-01-02 NOTE — Progress Notes (Signed)
° °  New Patient Visit   Subjective  Allison Lowe is a 68 y.o. female who presents for the following:  Total Body Skin Exam (TBSE)  Does not recall having skin cancer but her medical history lists that she has had a BCC. Has been several years since she was seen by dermatology.    The following portions of the chart were reviewed this encounter and updated as appropriate: medications, allergies, medical history  Review of Systems:  No other skin or systemic complaints except as noted in HPI or Assessment and Plan.  Objective  Well appearing patient in no apparent distress; mood and affect are within normal limits.  A full examination was performed including scalp, head, eyes, ears, nose, lips, neck, chest, axillae, abdomen, back, buttocks, bilateral upper extremities, bilateral lower extremities, hands, feet, fingers, toes, fingernails, and toenails. All findings within normal limits unless otherwise noted below.     Relevant exam findings are noted in the Assessment and Plan.  Right Supraorbital Region 0.5 cm irregular dark macule    Assessment & Plan   LENTIGINES, SEBORRHEIC KERATOSES, HEMANGIOMAS - Benign normal skin lesions - Benign-appearing - Call for any changes  MELANOCYTIC NEVI - Tan-brown and/or pink-flesh-colored symmetric macules and papules - Benign appearing on exam today - Observation - Call clinic for new or changing moles - Recommend daily use of broad spectrum spf 30+ sunscreen to sun-exposed areas.   ACTINIC DAMAGE - Chronic condition, secondary to cumulative UV/sun exposure - diffuse scaly erythematous macules with underlying dyspigmentation - Recommend daily broad spectrum sunscreen SPF 30+ to sun-exposed areas, reapply every 2 hours as needed.  - Staying in the shade or wearing long sleeves, sun glasses (UVA+UVB protection) and wide brim hats (4-inch brim around the entire circumference of the hat) are also recommended for sun protection.  - Call for new  or changing lesions.  SKIN CANCER SCREENING PERFORMED TODAY NEOPLASM OF UNCERTAIN BEHAVIOR Right Supraorbital Region - Epidermal / dermal shaving  Lesion diameter (cm):  0.5 Informed consent: discussed and consent obtained   Timeout: patient name, date of birth, surgical site, and procedure verified   Procedure prep:  Patient was prepped and draped in usual sterile fashion Prep type:  Isopropyl alcohol Anesthesia: the lesion was anesthetized in a standard fashion   Anesthetic:  1% lidocaine w/ epinephrine 1-100,000 buffered w/ 8.4% NaHCO3 Instrument used: flexible razor blade   Hemostasis achieved with: pressure, aluminum chloride and electrodesiccation   Outcome: patient tolerated procedure well   Post-procedure details: sterile dressing applied and wound care instructions given   Dressing type: bandage and petrolatum    Specimen 1 - Surgical pathology Differential Diagnosis: DN VS MM  Check Margins: No LENTIGINES   ACTINIC SKIN DAMAGE   CHERRY ANGIOMA   SEBORRHEIC KERATOSIS   SCREENING EXAM FOR SKIN CANCER    Return in about 1 year (around 01/01/2025) for TBSE.   Documentation: I have reviewed the above documentation for accuracy and completeness, and I agree with the above.  Marthann Abshier K, PA-C

## 2024-01-04 LAB — SURGICAL PATHOLOGY

## 2024-01-08 ENCOUNTER — Ambulatory Visit: Payer: Self-pay | Admitting: Physician Assistant

## 2024-01-31 ENCOUNTER — Ambulatory Visit
Admission: RE | Admit: 2024-01-31 | Discharge: 2024-01-31 | Disposition: A | Discharge status: Home / Self Care | Source: Ambulatory Visit | Attending: Acute Care | Admitting: Acute Care

## 2024-01-31 DIAGNOSIS — Z87891 Personal history of nicotine dependence: Secondary | ICD-10-CM | POA: Diagnosis present

## 2024-01-31 DIAGNOSIS — Z122 Encounter for screening for malignant neoplasm of respiratory organs: Secondary | ICD-10-CM | POA: Insufficient documentation

## 2024-02-01 ENCOUNTER — Other Ambulatory Visit: Payer: Self-pay | Admitting: Family

## 2024-02-01 DIAGNOSIS — E782 Mixed hyperlipidemia: Secondary | ICD-10-CM

## 2024-02-08 ENCOUNTER — Other Ambulatory Visit: Payer: Self-pay

## 2024-02-08 DIAGNOSIS — Z87891 Personal history of nicotine dependence: Secondary | ICD-10-CM

## 2024-02-08 DIAGNOSIS — Z122 Encounter for screening for malignant neoplasm of respiratory organs: Secondary | ICD-10-CM

## 2024-02-09 ENCOUNTER — Telehealth: Payer: Self-pay

## 2024-02-09 ENCOUNTER — Telehealth: Payer: Self-pay | Admitting: *Deleted

## 2024-02-09 NOTE — Telephone Encounter (Signed)
 Patient just received a call and trying to call cal now but no response I think its concerning some results . Patient is wanting to speak with someone called twice but still no answer please give patient a call back  [phone]

## 2024-02-09 NOTE — Telephone Encounter (Signed)
 Pt mailbox full: Scan for next year entered pt states she received a call from office. There are not any other messages in chart. Due 01/31/2025  Annual LCS  Former 20 pk Hx Quit 07/2022   Copied from CRM #[id number]. Topic: Clinical - Lab/Test Results >> Feb 08, 2024  9:14 AM Corean SAUNDERS wrote: Reason for CRM: Patient missed a call from [facility] regarding her LCS results.  Patient states it will be better if someone can call her back at or around 10 AM 1/21 >> Feb 08, 2024 11:54 AM Ismael A wrote: Pt called back stating she received a call from someone but is not sure what it was regarding. I advised new orders for another lung cancer screening was placed but she is not sure why if she just had one done last week on 01/31/24

## 2024-02-09 NOTE — Telephone Encounter (Signed)
 Returned call and reviewed results. See encounter.

## 2024-02-09 NOTE — Telephone Encounter (Signed)
 Returned call. Reviewed recent Lung CT results. She will complete an annual mammogram. Discussed fatty liver and emphysema. Results and plan to PCP.   IMPRESSION: 1. Lung-RADS 1, negative. Continue annual screening with low-dose chest CT without contrast in 12 months. 2. 7 mm nodule in the medial right breast, unchanged. Patient is due for screening mammogram in February 2025. 3. Hepatic steatosis. 4.  Emphysema (ICD10-J43.9).

## 2024-03-13 ENCOUNTER — Encounter

## 2025-01-02 ENCOUNTER — Ambulatory Visit: Admitting: Physician Assistant
